# Patient Record
Sex: Female | Born: 1955
Health system: Southern US, Community
[De-identification: ages and names within clinical notes are randomized; demographics above are authoritative.]

## PROBLEM LIST (undated history)

## (undated) DIAGNOSIS — I10 Essential (primary) hypertension: Secondary | ICD-10-CM

## (undated) DIAGNOSIS — M1990 Unspecified osteoarthritis, unspecified site: Secondary | ICD-10-CM

## (undated) DIAGNOSIS — E7800 Pure hypercholesterolemia, unspecified: Secondary | ICD-10-CM

## (undated) DIAGNOSIS — E079 Disorder of thyroid, unspecified: Secondary | ICD-10-CM

## (undated) HISTORY — PX: BILATERAL SALPINGECTOMY: SHX5743

---

## 2020-02-10 ENCOUNTER — Emergency Department (HOSPITAL_COMMUNITY): Payer: HRSA Program

## 2020-02-10 ENCOUNTER — Inpatient Hospital Stay (HOSPITAL_COMMUNITY)
Admission: EM | Admit: 2020-02-10 | Discharge: 2020-02-16 | DRG: 177 | Disposition: A | Discharge status: Home / Self Care | Payer: HRSA Program | Attending: Internal Medicine | Admitting: Internal Medicine

## 2020-02-10 ENCOUNTER — Encounter (HOSPITAL_COMMUNITY): Payer: Self-pay | Admitting: *Deleted

## 2020-02-10 DIAGNOSIS — N179 Acute kidney failure, unspecified: Secondary | ICD-10-CM

## 2020-02-10 DIAGNOSIS — Z79899 Other long term (current) drug therapy: Secondary | ICD-10-CM

## 2020-02-10 DIAGNOSIS — E669 Obesity, unspecified: Secondary | ICD-10-CM | POA: Diagnosis present

## 2020-02-10 DIAGNOSIS — E785 Hyperlipidemia, unspecified: Secondary | ICD-10-CM | POA: Diagnosis present

## 2020-02-10 DIAGNOSIS — R0602 Shortness of breath: Secondary | ICD-10-CM

## 2020-02-10 DIAGNOSIS — J9600 Acute respiratory failure, unspecified whether with hypoxia or hypercapnia: Secondary | ICD-10-CM | POA: Diagnosis present

## 2020-02-10 DIAGNOSIS — R7989 Other specified abnormal findings of blood chemistry: Secondary | ICD-10-CM

## 2020-02-10 DIAGNOSIS — E039 Hypothyroidism, unspecified: Secondary | ICD-10-CM | POA: Diagnosis present

## 2020-02-10 DIAGNOSIS — Z6830 Body mass index (BMI) 30.0-30.9, adult: Secondary | ICD-10-CM

## 2020-02-10 DIAGNOSIS — U071 COVID-19: Secondary | ICD-10-CM | POA: Diagnosis not present

## 2020-02-10 DIAGNOSIS — I10 Essential (primary) hypertension: Secondary | ICD-10-CM | POA: Diagnosis present

## 2020-02-10 DIAGNOSIS — J1282 Pneumonia due to coronavirus disease 2019: Secondary | ICD-10-CM | POA: Diagnosis present

## 2020-02-10 DIAGNOSIS — J9601 Acute respiratory failure with hypoxia: Secondary | ICD-10-CM | POA: Diagnosis present

## 2020-02-10 HISTORY — DX: Disorder of thyroid, unspecified: E07.9

## 2020-02-10 HISTORY — DX: Essential (primary) hypertension: I10

## 2020-02-10 HISTORY — DX: Pure hypercholesterolemia, unspecified: E78.00

## 2020-02-10 LAB — BASIC METABOLIC PANEL
Anion gap: 10 (ref 5–15)
BUN: 38 mg/dL — ABNORMAL HIGH (ref 8–23)
CO2: 21 mmol/L — ABNORMAL LOW (ref 22–32)
Calcium: 9 mg/dL (ref 8.9–10.3)
Chloride: 111 mmol/L (ref 98–111)
Creatinine, Ser: 1.66 mg/dL — ABNORMAL HIGH (ref 0.44–1.00)
GFR, Estimated: 35 mL/min — ABNORMAL LOW (ref >60)
Glucose, Bld: 124 mg/dL — ABNORMAL HIGH (ref 70–99)
Potassium: 4.1 mmol/L (ref 3.5–5.1)
Sodium: 142 mmol/L (ref 135–145)

## 2020-02-10 LAB — SARS CORONAVIRUS 2 (TAT 6-24 HRS): SARS Coronavirus 2: POSITIVE — AB

## 2020-02-10 LAB — CBC
HCT: 42.7 % (ref 36.0–46.0)
Hemoglobin: 13.8 g/dL (ref 12.0–15.0)
MCH: 31.2 pg (ref 26.0–34.0)
MCHC: 32.3 g/dL (ref 30.0–36.0)
MCV: 96.4 fL (ref 80.0–100.0)
Platelets: 226 10*3/uL (ref 150–400)
RBC: 4.43 MIL/uL (ref 3.87–5.11)
RDW: 15.1 % (ref 11.5–15.5)
WBC: 6.3 10*3/uL (ref 4.0–10.5)
nRBC: 0 % (ref 0.0–0.2)

## 2020-02-10 NOTE — ED Triage Notes (Signed)
Per phone interpretor:  Pt is complaining of 2 weeks of back of head pain, nose hurts, cannot breath well, and has a cough.  Denies fever. Pt is alert and in no active distress. Pt sounds congested

## 2020-02-10 NOTE — ED Notes (Signed)
This RN ambulated pt in room O2 sats decrease to 87-88% however while at rest Pt o2 sats increase to 94% on room air notified MD

## 2020-02-11 ENCOUNTER — Encounter (HOSPITAL_COMMUNITY): Payer: Self-pay | Admitting: Internal Medicine

## 2020-02-11 DIAGNOSIS — E785 Hyperlipidemia, unspecified: Secondary | ICD-10-CM | POA: Diagnosis present

## 2020-02-11 DIAGNOSIS — Z79899 Other long term (current) drug therapy: Secondary | ICD-10-CM | POA: Diagnosis not present

## 2020-02-11 DIAGNOSIS — J1282 Pneumonia due to coronavirus disease 2019: Secondary | ICD-10-CM | POA: Diagnosis present

## 2020-02-11 DIAGNOSIS — E039 Hypothyroidism, unspecified: Secondary | ICD-10-CM | POA: Diagnosis present

## 2020-02-11 DIAGNOSIS — N179 Acute kidney failure, unspecified: Secondary | ICD-10-CM | POA: Diagnosis present

## 2020-02-11 DIAGNOSIS — E669 Obesity, unspecified: Secondary | ICD-10-CM | POA: Diagnosis present

## 2020-02-11 DIAGNOSIS — J9600 Acute respiratory failure, unspecified whether with hypoxia or hypercapnia: Secondary | ICD-10-CM | POA: Diagnosis present

## 2020-02-11 DIAGNOSIS — J9601 Acute respiratory failure with hypoxia: Secondary | ICD-10-CM | POA: Diagnosis present

## 2020-02-11 DIAGNOSIS — I10 Essential (primary) hypertension: Secondary | ICD-10-CM

## 2020-02-11 DIAGNOSIS — Z6830 Body mass index (BMI) 30.0-30.9, adult: Secondary | ICD-10-CM | POA: Diagnosis not present

## 2020-02-11 DIAGNOSIS — U071 COVID-19: Principal | ICD-10-CM | POA: Diagnosis present

## 2020-02-11 DIAGNOSIS — R7989 Other specified abnormal findings of blood chemistry: Secondary | ICD-10-CM

## 2020-02-11 LAB — D-DIMER, QUANTITATIVE
D-Dimer, Quant: 1.87 ug/mL-FEU — ABNORMAL HIGH (ref 0.00–0.50)
D-Dimer, Quant: 1.65 ug/mL-FEU — ABNORMAL HIGH (ref 0.00–0.50)

## 2020-02-11 LAB — COMPREHENSIVE METABOLIC PANEL
ALT: 34 U/L (ref 0–44)
AST: 79 U/L — ABNORMAL HIGH (ref 15–41)
Albumin: 2.6 g/dL — ABNORMAL LOW (ref 3.5–5.0)
Alkaline Phosphatase: 126 U/L (ref 38–126)
Anion gap: 12 (ref 5–15)
BUN: 34 mg/dL — ABNORMAL HIGH (ref 8–23)
CO2: 18 mmol/L — ABNORMAL LOW (ref 22–32)
Calcium: 8.1 mg/dL — ABNORMAL LOW (ref 8.9–10.3)
Chloride: 109 mmol/L (ref 98–111)
Creatinine, Ser: 1.58 mg/dL — ABNORMAL HIGH (ref 0.44–1.00)
GFR, Estimated: 37 mL/min — ABNORMAL LOW (ref >60)
Glucose, Bld: 315 mg/dL — ABNORMAL HIGH (ref 70–99)
Potassium: 3.9 mmol/L (ref 3.5–5.1)
Sodium: 139 mmol/L (ref 135–145)
Total Bilirubin: 1 mg/dL (ref 0.3–1.2)
Total Protein: 6.7 g/dL (ref 6.5–8.1)

## 2020-02-11 LAB — CREATININE, SERUM
Creatinine, Ser: 1.62 mg/dL — ABNORMAL HIGH (ref 0.44–1.00)
GFR, Estimated: 36 mL/min — ABNORMAL LOW (ref >60)

## 2020-02-11 LAB — CBC
HCT: 37.8 % (ref 36.0–46.0)
Hemoglobin: 12 g/dL (ref 12.0–15.0)
MCH: 30.8 pg (ref 26.0–34.0)
MCHC: 31.7 g/dL (ref 30.0–36.0)
MCV: 96.9 fL (ref 80.0–100.0)
Platelets: 214 10*3/uL (ref 150–400)
RBC: 3.9 MIL/uL (ref 3.87–5.11)
RDW: 14.9 % (ref 11.5–15.5)
WBC: 5 10*3/uL (ref 4.0–10.5)
nRBC: 0 % (ref 0.0–0.2)

## 2020-02-11 LAB — FIBRINOGEN: Fibrinogen: 641 mg/dL — ABNORMAL HIGH (ref 210–475)

## 2020-02-11 LAB — HIV ANTIBODY (ROUTINE TESTING W REFLEX): HIV Screen 4th Generation wRfx: NONREACTIVE

## 2020-02-11 LAB — TRIGLYCERIDES: Triglycerides: 123 mg/dL (ref <150)

## 2020-02-11 LAB — FERRITIN: Ferritin: 616 ng/mL — ABNORMAL HIGH (ref 11–307)

## 2020-02-11 LAB — C-REACTIVE PROTEIN
CRP: 17.8 mg/dL — ABNORMAL HIGH (ref <1.0)
CRP: 14.6 mg/dL — ABNORMAL HIGH (ref <1.0)

## 2020-02-11 LAB — LACTATE DEHYDROGENASE: LDH: 438 U/L — ABNORMAL HIGH (ref 98–192)

## 2020-02-11 LAB — LACTIC ACID, PLASMA: Lactic Acid, Venous: 1.7 mmol/L (ref 0.5–1.9)

## 2020-02-11 LAB — PROCALCITONIN: Procalcitonin: 0.15 ng/mL

## 2020-02-11 MED ORDER — METHYLPREDNISOLONE SODIUM SUCC 125 MG IJ SOLR
80.0000 mg | Freq: Once | INTRAMUSCULAR | Status: AC
  Administered 2020-02-11: 80 mg via INTRAVENOUS
  Filled 2020-02-11: qty 2

## 2020-02-11 MED ORDER — PREDNISONE 20 MG PO TABS: 50.0000 mg | ORAL_TABLET | Freq: Every day | ORAL | Status: DC

## 2020-02-11 MED ORDER — SODIUM CHLORIDE 0.9 % IV SOLN
200.0000 mg | Freq: Once | INTRAVENOUS | Status: AC
  Administered 2020-02-11: 200 mg via INTRAVENOUS
  Filled 2020-02-11: qty 40

## 2020-02-11 MED ORDER — ENOXAPARIN SODIUM 40 MG/0.4ML ~~LOC~~ SOLN
40.0000 mg | SUBCUTANEOUS | Status: DC
  Administered 2020-02-11 – 2020-02-15 (×5): 40 mg via SUBCUTANEOUS
  Filled 2020-02-11 (×4): qty 0.4

## 2020-02-11 MED ORDER — ASCORBIC ACID 500 MG PO TABS
500.0000 mg | ORAL_TABLET | Freq: Every day | ORAL | Status: DC
  Administered 2020-02-11 – 2020-02-16 (×6): 500 mg via ORAL
  Filled 2020-02-11 (×6): qty 1

## 2020-02-11 MED ORDER — HYDROCOD POLST-CPM POLST ER 10-8 MG/5ML PO SUER: 5.0000 mL | Freq: Two times a day (BID) | ORAL | Status: DC | PRN

## 2020-02-11 MED ORDER — HYDRALAZINE HCL 20 MG/ML IJ SOLN: 10.0000 mg | INTRAMUSCULAR | Status: DC | PRN

## 2020-02-11 MED ORDER — ZINC SULFATE 220 (50 ZN) MG PO CAPS
220.0000 mg | ORAL_CAPSULE | Freq: Every day | ORAL | Status: DC
  Administered 2020-02-11 – 2020-02-16 (×6): 220 mg via ORAL
  Filled 2020-02-11 (×6): qty 1

## 2020-02-11 MED ORDER — METHYLPREDNISOLONE SODIUM SUCC 40 MG IJ SOLR
0.5000 mg/kg | Freq: Two times a day (BID) | INTRAMUSCULAR | Status: DC
  Administered 2020-02-11 – 2020-02-12 (×3): 38.4 mg via INTRAVENOUS
  Filled 2020-02-11 (×2): qty 1

## 2020-02-11 MED ORDER — GUAIFENESIN-DM 100-10 MG/5ML PO SYRP
10.0000 mL | ORAL_SOLUTION | ORAL | Status: DC | PRN
Start: 2020-02-11 — End: 2020-02-16

## 2020-02-11 MED ORDER — ACETAMINOPHEN 325 MG PO TABS: 650.0000 mg | ORAL_TABLET | Freq: Four times a day (QID) | ORAL | Status: DC | PRN

## 2020-02-11 MED ORDER — ALBUTEROL SULFATE HFA 108 (90 BASE) MCG/ACT IN AERS
4.0000 | INHALATION_SPRAY | Freq: Once | RESPIRATORY_TRACT | Status: AC
  Administered 2020-02-11: 4 via RESPIRATORY_TRACT
  Filled 2020-02-11: qty 6.7

## 2020-02-11 MED ORDER — ACETAMINOPHEN 650 MG RE SUPP: 650.0000 mg | Freq: Four times a day (QID) | RECTAL | Status: DC | PRN

## 2020-02-11 MED ORDER — SODIUM CHLORIDE 0.9 % IV SOLN
100.0000 mg | Freq: Every day | INTRAVENOUS | Status: AC
  Administered 2020-02-12 – 2020-02-15 (×4): 100 mg via INTRAVENOUS
  Filled 2020-02-11 (×4): qty 20

## 2020-02-11 MED ORDER — SODIUM CHLORIDE 0.9 % IV BOLUS
500.0000 mL | Freq: Once | INTRAVENOUS | Status: AC
  Administered 2020-02-11: 500 mL via INTRAVENOUS

## 2020-02-11 MED ORDER — ATORVASTATIN CALCIUM 10 MG PO TABS
20.0000 mg | ORAL_TABLET | Freq: Every day | ORAL | Status: DC
  Administered 2020-02-11 – 2020-02-16 (×6): 20 mg via ORAL
  Filled 2020-02-11 (×6): qty 2

## 2020-02-11 NOTE — H&P (Signed)
History and Physical    Amanda Blanchard DTO:[id number] DOB: 1955/01/23 DOA: 02/10/2020  PCP: Patient, No Pcp Per  Patient coming from: Home.  Chief Complaint: Shortness of breath.  Spanish interpreter used.  HPI: Amanda Blanchard is a 65 y.o. female with history of hypertension and hyperlipidemia presents to the ER because of worsening shortness of breath over the last 2 weeks.  Patient has been a nonproductive cough with some pleuritic chest pain.  Has not taken any Covid vaccination.  ED Course: The ER patient was hypoxic requiring 2 L oxygen when was appearing tachypneic chest x-ray showing infiltrates consistent with pneumonia and Covid test was positive.  CRP was 17.8 D-dimer was 1.87.  Patient admitted for further management of acute respiratory failure with hypoxia secondary to Covid infection.  Review of Systems: As per HPI, rest all negative.   Past Medical History:  Diagnosis Date  . Hypercholesteremia   . Hypertension   . Thyroid disease     History reviewed. No pertinent surgical history.   reports that she has never smoked. She has never used smokeless tobacco. She reports previous alcohol use. She reports previous drug use.  Not on File  Family History  Problem Relation Age of Onset  . Diabetes Mellitus II Neg Hx     Prior to Admission medications   Medication Sig Start Date End Date Taking? Authorizing Provider  atorvastatin (LIPITOR) 20 MG tablet Take 20 mg by mouth daily. 01/26/20   [provider]  diclofenac (VOLTAREN) 75 MG EC tablet Take 75 mg by mouth 2 (two) times daily as needed. 01/26/20   [provider]  fluconazole (DIFLUCAN) 150 MG tablet Take 150 mg by mouth once a week. 01/26/20   [provider]  lisinopril-hydrochlorothiazide (ZESTORETIC) 10-12.5 MG tablet Take 1 tablet by mouth daily. 01/26/20   [provider]  sulfamethoxazole-trimethoprim (BACTRIM DS) 800-160 MG tablet Take 1 tablet by mouth 2  (two) times daily. 01/26/20   [provider]    Physical Exam: Constitutional: Moderately built and nourished. Vitals:   02/10/20 2200 02/11/20 0000 02/11/20 0100 02/11/20 0145  BP: 121/70  (!) 102/51 (!) 110/57  Pulse: 86  84 70  Resp: (!) 27  18 20   Temp:      TempSrc:      SpO2: 92%  91% 100%  Weight:  77.1 kg    Height:  5\' 3"  (1.6 m)     Eyes: Anicteric no pallor. ENMT: No discharge from the ears eyes nose or mouth. Neck: No mass felt.  No neck rigidity. Respiratory: No rhonchi or crepitations. Cardiovascular: S1-S2 heard. Abdomen: Soft nontender bowel sounds present. Musculoskeletal: No edema. Skin: No rash. Neurologic: Alert awake oriented to time place and person.  Moves all extremities. Psychiatric: Appears normal.  Normal affect.   Labs on Admission: I have personally reviewed following labs and imaging studies  CBC: Recent Labs  Lab 02/10/20 1225  WBC 6.3  HGB 13.8  HCT 42.7  MCV 96.4  PLT 226   Basic Metabolic Panel: Recent Labs  Lab 02/10/20 1225  NA 142  K 4.1  CL 111  CO2 21*  GLUCOSE 124*  BUN 38*  CREATININE 1.66*  CALCIUM 9.0   GFR: Estimated Creatinine Clearance: 34.6 mL/min (A) (by C-G formula based on SCr of 1.66 mg/dL (H)). Liver Function Tests: No results for input(s): AST, ALT, ALKPHOS, BILITOT, PROT, ALBUMIN in the last 168 hours. No results for input(s): LIPASE, AMYLASE in the last  168 hours. No results for input(s): AMMONIA in the last 168 hours. Coagulation Profile: No results for input(s): INR, PROTIME in the last 168 hours. Cardiac Enzymes: No results for input(s): CKTOTAL, CKMB, CKMBINDEX, TROPONINI in the last 168 hours. BNP (last 3 results) No results for input(s): PROBNP in the last 8760 hours. HbA1C: No results for input(s): HGBA1C in the last 72 hours. CBG: No results for input(s): GLUCAP in the last 168 hours. Lipid Profile: Recent Labs    02/11/20 0105  TRIG 123   Thyroid Function Tests: No  results for input(s): TSH, T4TOTAL, FREET4, T3FREE, THYROIDAB in the last 72 hours. Anemia Panel: Recent Labs    02/11/20 0105  FERRITIN 616*   Urine analysis: No results found for: COLORURINE, APPEARANCEUR, LABSPEC, PHURINE, GLUCOSEU, HGBUR, BILIRUBINUR, KETONESUR, PROTEINUR, UROBILINOGEN, NITRITE, LEUKOCYTESUR Sepsis Labs: @LABRCNTIP (procalcitonin:4,lacticidven:4) ) Recent Results (from the past 240 hour(s))  SARS CORONAVIRUS 2 (TAT 6-24 HRS) Nasopharyngeal Nasopharyngeal Swab     Status: Abnormal   Collection Time: 02/10/20 12:24 PM   Specimen: Nasopharyngeal Swab  Result Value Ref Range Status   SARS Coronavirus 2 POSITIVE (A) NEGATIVE Final    Comment: (NOTE) SARS-CoV-2 target nucleic acids are DETECTED.  The SARS-CoV-2 RNA is generally detectable in upper and lower respiratory specimens during the acute phase of infection. Positive results are indicative of the presence of SARS-CoV-2 RNA. Clinical correlation with patient history and other diagnostic information is  necessary to determine patient infection status. Positive results do not rule out bacterial infection or co-infection with other viruses.  The expected result is Negative.  Fact Sheet for Patients: 02/12/20  Fact Sheet for Healthcare Providers: HairSlick.no  This test is not yet approved or cleared by the quierodirigir.com FDA and  has been authorized for detection and/or diagnosis of SARS-CoV-2 by FDA under an Emergency Use Authorization (EUA). This EUA will remain  in effect (meaning this test can be used) for the duration of the COVID-19 declaration under Section 564(b)(1) of the Act, 21 U. S.C. section 360bbb-3(b)(1), unless the authorization is terminated or revoked sooner.   Performed at [facility], [address], [place], [place]      Radiological Exams on Admission: DG Chest Portable 1 View  Result Date:  02/10/2020 CLINICAL DATA:  Cough.  Shortness of breath. EXAM: PORTABLE CHEST 1 VIEW COMPARISON:  No prior. FINDINGS: Heart size normal. Diffuse bilateral interstitial infiltrates most consistent with pneumonitis. No pleural effusion or pneumothorax. Degenerative change thoracic spine. IMPRESSION: Diffuse bilateral interstitial infiltrates most consistent with pneumonitis. Question patient's COVID status. Electronically Signed   By: 02/12/2020  Register   On: 02/10/2020 12:52    EKG: Independently reviewed.  Normal sinus rhythm.  Assessment/Plan Principal Problem:   Acute respiratory failure due to COVID-19 [facility]) Active Problems:   Essential hypertension   Hypothyroidism   HLD (hyperlipidemia)    1. Acute respiratory failure with hypoxia presently on 2 L oxygen to maintain sats more than 90% likely from Covid infection for which patient is on IV Solu-Medrol and remdesivir follow including markers respiratory status. 2. Acute renal failure no old labs to compare could be from dehydration holding lisinopril hydrochlorothiazide for now.  Patient received 1 L fluid bolus in the ER.  Follow metabolic panel. 3. History hypertension holding lisinopril hydrochlorothiazide due to acute renal failure we will keep patient on as needed IV hydralazine. 4. History of hyperlipidemia on statins.  Since patient has acute respiratory failure with hypoxia with Covid infection will need inpatient  status.   DVT prophylaxis: Lovenox. Code Status: Full code. Family Communication: Discussed with patient. Disposition Plan: Home. Consults called: None. Admission status: Inpatient.   Eduard Clos MD [facility] Pager [phone].  If 7PM-7AM, please contact night-coverage [email or web address] Password [email or web address]  02/11/2020, 3:02 AM

## 2020-02-11 NOTE — ED Provider Notes (Signed)
[facility] EMERGENCY DEPARTMENT Provider Note   CSN: [id number] Arrival date & time: 02/10/20  1023     History Chief Complaint  Patient presents with  . Cough  . Headache  . Chest Pain    Amanda Blanchard is a 65 y.o. female with a h/o of HTN, hypercholesteremia, and thyroid disease who presents the emergency department with a chief complaint of shortness of breath.  The patient reports a 2-week history of progressively worsening shortness of breath, cough, fatigue, myalgias, chills, nasal congestion, and headaches.  No known aggravating or alleviating factors.  No vomiting, diarrhea, abdominal pain, dysuria, hematuria, decreased urinary output, dizziness, lightheadedness, syncope, neck pain or stiffness, chest pain, leg swelling.  She is unvaccinated against COVID-19.  No history of problems with her kidneys.  She does report that she has a thyroid problem, but is unsure of the the diagnosis.  The history is provided by the patient and medical records. A language interpreter was used ([place]).       Past Medical History:  Diagnosis Date  . Hypercholesteremia   . Hypertension   . Thyroid disease     Patient Active Problem List   Diagnosis Date Noted  . Acute respiratory failure due to COVID-19 (HCC) 02/11/2020  . Essential hypertension 02/11/2020  . Hypothyroidism 02/11/2020  . HLD (hyperlipidemia) 02/11/2020    History reviewed. No pertinent surgical history.   OB History   No obstetric history on file.     Family History  Problem Relation Age of Onset  . Diabetes Mellitus II Neg Hx     Social History   Tobacco Use  . Smoking status: Never Smoker  . Smokeless tobacco: Never Used  Substance Use Topics  . Alcohol use: Not Currently  . Drug use: Not Currently    Home Medications Prior to Admission medications   Medication Sig Start Date End Date Taking? Authorizing Provider  atorvastatin (LIPITOR) 20 MG tablet Take 20 mg by  mouth daily. 01/26/20  Yes [provider]  diclofenac (VOLTAREN) 75 MG EC tablet Take 75 mg by mouth 2 (two) times daily as needed for mild pain. 01/26/20  Yes [provider]  lisinopril-hydrochlorothiazide (ZESTORETIC) 10-12.5 MG tablet Take 1 tablet by mouth daily. 01/26/20  Yes [provider]  fluconazole (DIFLUCAN) 150 MG tablet Take 150 mg by mouth once a week. Patient not taking: Reported on 02/11/2020 01/26/20   [provider]  sulfamethoxazole-trimethoprim (BACTRIM DS) 800-160 MG tablet Take 1 tablet by mouth 2 (two) times daily. Patient not taking: Reported on 02/11/2020 01/26/20   [provider]    Allergies    Patient has no allergy information on record.  Review of Systems   Review of Systems  Constitutional: Positive for chills and fatigue. Negative for fever.  HENT: Positive for congestion. Negative for ear discharge, ear pain, sinus pressure, sinus pain and sore throat.   Eyes: Negative for visual disturbance.  Respiratory: Positive for cough and shortness of breath.   Cardiovascular: Negative for palpitations.  Gastrointestinal: Negative for abdominal pain, constipation, diarrhea, nausea and vomiting.  Genitourinary: Negative for difficulty urinating, dysuria, urgency and vaginal pain.  Musculoskeletal: Positive for myalgias. Negative for arthralgias, neck pain and neck stiffness.  Skin: Negative for wound.  Allergic/Immunologic: Negative for immunocompromised state.  Neurological: Positive for headaches. Negative for seizures, weakness and numbness.    Physical Exam Updated Vital Signs BP (!) 110/57   Pulse 70   Temp 98.2 F (36.8 C) (Oral)  Resp 20   Ht 5\' 3"  (1.6 m)   Wt 77.1 kg   SpO2 100%   BMI 30.11 kg/m   Physical Exam Vitals and nursing note reviewed.  Constitutional:      General: She is not in acute distress.    Appearance: She is not ill-appearing or toxic-appearing.  HENT:     Head: Normocephalic.      Nose: Congestion present. No rhinorrhea.     Mouth/Throat:     Mouth: Mucous membranes are moist.  Eyes:     Conjunctiva/sclera: Conjunctivae normal.  Cardiovascular:     Rate and Rhythm: Normal rate and regular rhythm.     Heart sounds: No murmur heard. No friction rub. No gallop.   Pulmonary:     Effort: No respiratory distress.     Breath sounds: No stridor. No wheezing, rhonchi or rales.     Comments: Able to speak in complete, fluent sentences at rest, but patient becomes markedly tachypneic with any exertion, including leaning forward on exam.  Lungs are clear to auscultation bilaterally. Abdominal:     General: There is no distension.     Palpations: Abdomen is soft. There is no mass.     Tenderness: There is no abdominal tenderness. There is no right CVA tenderness, left CVA tenderness, guarding or rebound.     Hernia: No hernia is present.  Musculoskeletal:        General: No tenderness.     Cervical back: Neck supple.     Right lower leg: No edema.     Left lower leg: No edema.  Skin:    General: Skin is warm.     Capillary Refill: Capillary refill takes less than 2 seconds.     Coloration: Skin is not jaundiced.     Findings: No rash.  Neurological:     Mental Status: She is alert.  Psychiatric:        Behavior: Behavior normal.     ED Results / Procedures / Treatments   Labs (all labs ordered are listed, but only abnormal results are displayed) Labs Reviewed  SARS CORONAVIRUS 2 (TAT 6-24 HRS) - Abnormal; Notable for the following components:      Result Value   SARS Coronavirus 2 POSITIVE (*)    All other components within normal limits  BASIC METABOLIC PANEL - Abnormal; Notable for the following components:   CO2 21 (*)    Glucose, Bld 124 (*)    BUN 38 (*)    Creatinine, Ser 1.66 (*)    GFR, Estimated 35 (*)    All other components within normal limits  D-DIMER, QUANTITATIVE (NOT AT [facility]) - Abnormal; Notable for the following components:   D-Dimer,  Quant 1.87 (*)    All other components within normal limits  LACTATE DEHYDROGENASE - Abnormal; Notable for the following components:   LDH 438 (*)    All other components within normal limits  FERRITIN - Abnormal; Notable for the following components:   Ferritin 616 (*)    All other components within normal limits  FIBRINOGEN - Abnormal; Notable for the following components:   Fibrinogen 641 (*)    All other components within normal limits  C-REACTIVE PROTEIN - Abnormal; Notable for the following components:   CRP 17.8 (*)    All other components within normal limits  CREATININE, SERUM - Abnormal; Notable for the following components:   Creatinine, Ser 1.62 (*)    GFR, Estimated 36 (*)    All  other components within normal limits  CULTURE, BLOOD (ROUTINE X 2)  CULTURE, BLOOD (ROUTINE X 2)  CBC  LACTIC ACID, PLASMA  PROCALCITONIN  TRIGLYCERIDES  CBC  HIV ANTIBODY (ROUTINE TESTING W REFLEX)    EKG None  Radiology DG Chest Portable 1 View  Result Date: 02/10/2020 CLINICAL DATA:  Cough.  Shortness of breath. EXAM: PORTABLE CHEST 1 VIEW COMPARISON:  No prior. FINDINGS: Heart size normal. Diffuse bilateral interstitial infiltrates most consistent with pneumonitis. No pleural effusion or pneumothorax. Degenerative change thoracic spine. IMPRESSION: Diffuse bilateral interstitial infiltrates most consistent with pneumonitis. Question patient's COVID status. Electronically Signed   By: Maisie Fus  Register   On: 02/10/2020 12:52    Procedures Procedures   Medications Ordered in ED Medications  atorvastatin (LIPITOR) tablet 20 mg (has no administration in time range)  enoxaparin (LOVENOX) injection 40 mg (has no administration in time range)  acetaminophen (TYLENOL) tablet 650 mg (has no administration in time range)    Or  acetaminophen (TYLENOL) suppository 650 mg (has no administration in time range)  remdesivir 200 mg in sodium chloride 0.9% 250 mL IVPB (has no administration in  time range)    Followed by  remdesivir 100 mg in sodium chloride 0.9 % 100 mL IVPB (has no administration in time range)  methylPREDNISolone sodium succinate (SOLU-MEDROL) 40 mg/mL injection 38.4 mg (38.4 mg Intravenous Given 02/11/20 0340)    Followed by  predniSONE (DELTASONE) tablet 50 mg (has no administration in time range)  guaiFENesin-dextromethorphan (ROBITUSSIN DM) 100-10 MG/5ML syrup 10 mL (has no administration in time range)  chlorpheniramine-HYDROcodone (TUSSIONEX) 10-8 MG/5ML suspension 5 mL (has no administration in time range)  ascorbic acid (VITAMIN C) tablet 500 mg (has no administration in time range)  zinc sulfate capsule 220 mg (has no administration in time range)  hydrALAZINE (APRESOLINE) injection 10 mg (has no administration in time range)  sodium chloride 0.9 % bolus 500 mL (0 mLs Intravenous Stopped 02/11/20 0357)  albuterol (VENTOLIN HFA) 108 (90 Base) MCG/ACT inhaler 4 puff (4 puffs Inhalation Given 02/11/20 0137)  methylPREDNISolone sodium succinate (SOLU-MEDROL) 125 mg/2 mL injection 80 mg (80 mg Intravenous Given 02/11/20 0232)    ED Course  I have reviewed the triage vital signs and the nursing notes.  Pertinent labs & imaging results that were available during my care of the patient were reviewed by me and considered in my medical decision making (see chart for details).  Clinical Course as of 02/11/20 0420  Wed Feb 11, 2020  0038 Patient with oxygen saturation of 90% at rest.  With positional changes, patient becomes increasingly tachypneic with respirations at approximately 35 a minute. [MM]    Clinical Course User Index [MM] Adasha Boehme, Coral Else, PA-C   MDM Rules/Calculators/A&P                          65 year old female with a h/o of HTN, hypercholesteremia, and thyroid disease who presents the emergency department with 2 weeks of URI symptoms.  Shortness of breath has been worsening.  Afebrile.  Normotensive.  No tachycardia.  Oxygen saturation has been  in the low 90s at rest, but on my evaluation is 90%.  On exam, patient becomes very tachypneic with minimal movement, including even leaning forward for exam with respiration rate increasing to 35.  The patient was ambulated in the emergency department and had oxygen saturation of 87 to 87%.  Labs and imaging have been reviewed and independently interpreted by me.  COVID-19 test is positive.  She also has a creatinine of 1.66.  Upon further investigation, no history of kidney disease.  I am suspicious of acute AKI.  Will order IV fluid bolus, Solu-Medrol, and albuterol.  Patient is not a candidate for remdesivir since she has been having symptoms for 14 days.  The patient will require admission for acute respiratory failure secondary to COVID-19.  Consult to the hospitalist team and Dr. Toniann Fail will accept the patient for admission. The patient appears reasonably stabilized for admission considering the current resources, flow, and capabilities available in the ED at this time, and I doubt any other [facility] requiring further screening and/or treatment in the ED prior to admission.   Final Clinical Impression(s) / ED Diagnoses Final diagnoses:  Acute respiratory failure with hypoxia (HCC)  Elevated serum creatinine    Rx / DC Orders ED Discharge Orders    None       Barkley Boards, PA-C 02/11/20 0420    Geoffery Lyons, MD 02/11/20 [phone]

## 2020-02-11 NOTE — ED Notes (Signed)
Pt ambulatory to RR, no assistance needed from staff; gait steady & even

## 2020-02-11 NOTE — ED Notes (Signed)
Lunch Tray Ordered @ 1032. 

## 2020-02-11 NOTE — Progress Notes (Signed)
PROGRESS NOTE                                                                                                                                                                                                             Amanda Blanchard Demographics:    Amanda Blanchard, is a 65 y.o. female, DOB - 14-Jun-1955, ALP:[id number]  Outpatient Primary MD for the Amanda Blanchard is Amanda Blanchard, No Pcp Per   Admit date - 02/10/2020   LOS - 0  Chief Complaint  Amanda Blanchard presents with  . Cough  . Headache  . Chest Pain       Brief Narrative: Amanda Blanchard is a 65 y.o. female with PMHx of HTN, HLD-presented to the hospital with worsening shortness of breath/cough-ongoing for 2 weeks but worsening over the past few days-found to have acute hypoxic respiratory failure due to COVID-19 pneumonia.  COVID-19 vaccinated status: Unvaccinated  Significant Events: 1/25>> Admit to [facility] for hypoxia due to COVID-19 pneumonia  Significant studies: 1/25>>Chest x-ray:Diffuse bilateral infiltrates  COVID-19 medications: Steroids: 1/25>> Remdesivir: 1/25>>  Antibiotics: None  Microbiology data: 1/26 >>blood culture: Pending  Procedures: None  Consults: None  DVT prophylaxis: enoxaparin (LOVENOX) injection 40 mg Start: 02/11/20 0600    Subjective:    [name] today feels better-some cough on minimal oxygen on room air this morning.   Assessment  & Plan :   Acute Hypoxic Resp Failure due to Covid 19 Viral pneumonia: Has mild disease but significantly elevated CRP-continue steroids and Remdesivir.  If in the unlikely event that hypoxemia to worsen-she will be dosed with Actemra.  Continue at attempts to titrate off oxygen.  Fever: afebrile O2 requirements:  SpO2: 96 % O2 Flow Rate (L/min): 1.5 L/min   COVID-19 Labs: Recent Labs    02/11/20 0105  DDIMER 1.87*  FERRITIN 616*  LDH 438*  CRP 17.8*    No results found for:  BNP  Recent Labs  Lab 02/11/20 0105  PROCALCITON 0.15    Lab Results  Component Value Date   SARSCOV2NAA POSITIVE (A) 02/10/2020     Prone/Incentive Spirometry: encouraged  incentive spirometry use 3-4/hour.  AKI: Mild-likely hemodynamically mediated-in the setting of COVID-19 infection, lisinopril/HCTZ use-supportive care for now-repeat electrolytes tomorrow.  Avoid nephrotoxic agents.  HTN: BP stable without the use of antihypertensives-follow and resume when able.  HLD: Continue statin  Hypothyroidism: Continue levothyroxine  Obesity: Estimated body mass index is 30.11 kg/m as calculated from the following:   Height as of this encounter: 5\' 3"  (1.6 m).   Weight as of this encounter: 77.1 kg.   ABG: No results found for: PHART, PCO2ART, PO2ART, HCO3, TCO2, ACIDBASEDEF, O2SAT  Vent Settings: N/A   Condition - Stable  Family Communication  : Amanda Blanchard alert/awake-able to update family herself.  She is aware of potential plans for discharge in the next 1-2 days if clinical improvement continues.  Code Status :  Full Code  Diet :  Diet Order            Diet Heart Room service appropriate? Yes; Fluid consistency: Thin  Diet effective now                  Disposition Plan  :   Status is: Inpatient  Remains inpatient appropriate because:Inpatient level of care appropriate due to severity of illness   Dispo: The Amanda Blanchard is from: Home              Anticipated d/c is to: Home              Anticipated d/c date is: 2 days              Amanda Blanchard currently is not medically stable to d/c.   Difficult to place Amanda Blanchard No    Barriers to discharge: Hypoxia requiring O2 supplementation/complete 5 days of IV Remdesivir  Antimicorbials  :    Anti-infectives (From admission, onward)   Start     Dose/Rate Route Frequency Ordered Stop   02/12/20 1000  remdesivir 100 mg in sodium chloride 0.9 % 100 mL IVPB       "Followed by" Linked Group Details   100 mg 200 mL/hr over  30 Minutes Intravenous Daily 02/11/20 0301 02/16/20 0959   02/11/20 0300  remdesivir 200 mg in sodium chloride 0.9% 250 mL IVPB       "Followed by" Linked Group Details   200 mg 580 mL/hr over 30 Minutes Intravenous Once 02/11/20 0301 02/11/20 02/13/20      Inpatient Medications  Scheduled Meds: . vitamin C  500 mg Oral Daily  . atorvastatin  20 mg Oral Daily  . enoxaparin (LOVENOX) injection  40 mg Subcutaneous Q24H  . methylPREDNISolone (SOLU-MEDROL) injection  0.5 mg/kg Intravenous Q12H   Followed by  . [START ON 02/14/2020] predniSONE  50 mg Oral Daily  . zinc sulfate  220 mg Oral Daily   Continuous Infusions: . [START ON 02/12/2020] remdesivir 100 mg in NS 100 mL     PRN Meds:.acetaminophen **OR** acetaminophen, chlorpheniramine-HYDROcodone, guaiFENesin-dextromethorphan, hydrALAZINE   Time Spent in minutes  25  See all Orders from today for further details   02/14/2020 M.D on 02/11/2020 at 9:39 AM  To page go to [email or web address] - use universal password  [facility] -  Office  [phone]    Objective:   Vitals:   02/11/20 0645 02/11/20 0700 02/11/20 0800 02/11/20 0845  BP:   115/63 (!) 112/59  Pulse: (!) 58 (!) 59 75 74  Resp: 19 20 20 17   Temp:      TempSrc:      SpO2: 97% 99% 93% 96%  Weight:      Height:        Wt Readings from Last 3 Encounters:  02/11/20 77.1 kg     Intake/Output Summary (Last 24 hours) at 02/11/2020 02/13/20 Last data filed at 02/11/2020  0357 Gross per 24 hour  Intake 500 ml  Output --  Net 500 ml     Physical Exam Gen Exam:Alert awake-not in any distress HEENT:atraumatic, normocephalic Chest: B/L clear to auscultation anteriorly CVS:S1S2 regular Abdomen:soft non tender, non distended Extremities:no edema Neurology: Non focal Skin: no rash   Data Review:    CBC Recent Labs  Lab 02/10/20 1225 02/11/20 0318  WBC 6.3 5.0  HGB 13.8 12.0  HCT 42.7 37.8  PLT 226 214  MCV 96.4 96.9  MCH 31.2 30.8  MCHC 32.3  31.7  RDW 15.1 14.9    Chemistries  Recent Labs  Lab 02/10/20 1225 02/11/20 0318  NA 142  --   K 4.1  --   CL 111  --   CO2 21*  --   GLUCOSE 124*  --   BUN 38*  --   CREATININE 1.66* 1.62*  CALCIUM 9.0  --    ------------------------------------------------------------------------------------------------------------------ Recent Labs    02/11/20 0105  TRIG 123    No results found for: HGBA1C ------------------------------------------------------------------------------------------------------------------ No results for input(s): TSH, T4TOTAL, T3FREE, THYROIDAB in the last 72 hours.  Invalid input(s): FREET3 ------------------------------------------------------------------------------------------------------------------ Recent Labs    02/11/20 0105  FERRITIN 616*    Coagulation profile No results for input(s): INR, PROTIME in the last 168 hours.  Recent Labs    02/11/20 0105  DDIMER 1.87*    Cardiac Enzymes No results for input(s): CKMB, TROPONINI, MYOGLOBIN in the last 168 hours.  Invalid input(s): CK ------------------------------------------------------------------------------------------------------------------ No results found for: BNP  Micro Results Recent Results (from the past 240 hour(s))  SARS CORONAVIRUS 2 (TAT 6-24 HRS) Nasopharyngeal Nasopharyngeal Swab     Status: Abnormal   Collection Time: 02/10/20 12:24 PM   Specimen: Nasopharyngeal Swab  Result Value Ref Range Status   SARS Coronavirus 2 POSITIVE (A) NEGATIVE Final    Comment: (NOTE) SARS-CoV-2 target nucleic acids are DETECTED.  The SARS-CoV-2 RNA is generally detectable in upper and lower respiratory specimens during the acute phase of infection. Positive results are indicative of the presence of SARS-CoV-2 RNA. Clinical correlation with Amanda Blanchard history and other diagnostic information is  necessary to determine Amanda Blanchard infection status. Positive results do not rule out  bacterial infection or co-infection with other viruses.  The expected result is Negative.  Fact Sheet for Patients: HairSlick.no  Fact Sheet for Healthcare Providers: quierodirigir.com  This test is not yet approved or cleared by the [place] FDA and  has been authorized for detection and/or diagnosis of SARS-CoV-2 by FDA under an Emergency Use Authorization (EUA). This EUA will remain  in effect (meaning this test can be used) for the duration of the COVID-19 declaration under Section 564(b)(1) of the Act, 21 U. S.C. section 360bbb-3(b)(1), unless the authorization is terminated or revoked sooner.   Performed at [facility], [address] [place], [place] [address]     Radiology Reports DG Chest Portable 1 View  Result Date: 02/10/2020 CLINICAL DATA:  Cough.  Shortness of breath. EXAM: PORTABLE CHEST 1 VIEW COMPARISON:  No prior. FINDINGS: Heart size normal. Diffuse bilateral interstitial infiltrates most consistent with pneumonitis. No pleural effusion or pneumothorax. Degenerative change thoracic spine. IMPRESSION: Diffuse bilateral interstitial infiltrates most consistent with pneumonitis. Question Amanda Blanchard's COVID status. Electronically Signed   By: Maisie Fus  Register   On: 02/10/2020 12:52

## 2020-02-12 ENCOUNTER — Other Ambulatory Visit: Payer: Self-pay

## 2020-02-12 LAB — COMPREHENSIVE METABOLIC PANEL
ALT: 31 U/L (ref 0–44)
AST: 62 U/L — ABNORMAL HIGH (ref 15–41)
Albumin: 2.4 g/dL — ABNORMAL LOW (ref 3.5–5.0)
Alkaline Phosphatase: 107 U/L (ref 38–126)
Anion gap: 9 (ref 5–15)
BUN: 41 mg/dL — ABNORMAL HIGH (ref 8–23)
CO2: 19 mmol/L — ABNORMAL LOW (ref 22–32)
Calcium: 8.7 mg/dL — ABNORMAL LOW (ref 8.9–10.3)
Chloride: 111 mmol/L (ref 98–111)
Creatinine, Ser: 1.57 mg/dL — ABNORMAL HIGH (ref 0.44–1.00)
GFR, Estimated: 37 mL/min — ABNORMAL LOW (ref >60)
Glucose, Bld: 155 mg/dL — ABNORMAL HIGH (ref 70–99)
Potassium: 4.3 mmol/L (ref 3.5–5.1)
Sodium: 139 mmol/L (ref 135–145)
Total Bilirubin: 1.4 mg/dL — ABNORMAL HIGH (ref 0.3–1.2)
Total Protein: 6.2 g/dL — ABNORMAL LOW (ref 6.5–8.1)

## 2020-02-12 LAB — CBC WITH DIFFERENTIAL/PLATELET
Abs Immature Granulocytes: 0.05 10*3/uL (ref 0.00–0.07)
Basophils Absolute: 0 10*3/uL (ref 0.0–0.1)
Basophils Relative: 0 %
Eosinophils Absolute: 0 10*3/uL (ref 0.0–0.5)
Eosinophils Relative: 0 %
HCT: 34.4 % — ABNORMAL LOW (ref 36.0–46.0)
Hemoglobin: 11.7 g/dL — ABNORMAL LOW (ref 12.0–15.0)
Immature Granulocytes: 1 %
Lymphocytes Relative: 13 %
Lymphs Abs: 1 10*3/uL (ref 0.7–4.0)
MCH: 32.4 pg (ref 26.0–34.0)
MCHC: 34 g/dL (ref 30.0–36.0)
MCV: 95.3 fL (ref 80.0–100.0)
Monocytes Absolute: 0.4 10*3/uL (ref 0.1–1.0)
Monocytes Relative: 5 %
Neutro Abs: 6.6 10*3/uL (ref 1.7–7.7)
Neutrophils Relative %: 81 %
Platelets: 245 10*3/uL (ref 150–400)
RBC: 3.61 MIL/uL — ABNORMAL LOW (ref 3.87–5.11)
RDW: 14.6 % (ref 11.5–15.5)
WBC: 8.1 10*3/uL (ref 4.0–10.5)
nRBC: 0 % (ref 0.0–0.2)

## 2020-02-12 LAB — C-REACTIVE PROTEIN: CRP: 11 mg/dL — ABNORMAL HIGH (ref <1.0)

## 2020-02-12 LAB — D-DIMER, QUANTITATIVE: D-Dimer, Quant: 1.87 ug/mL-FEU — ABNORMAL HIGH (ref 0.00–0.50)

## 2020-02-12 MED ORDER — METHYLPREDNISOLONE SODIUM SUCC 125 MG IJ SOLR
60.0000 mg | Freq: Two times a day (BID) | INTRAMUSCULAR | Status: DC
  Administered 2020-02-12 – 2020-02-14 (×5): 60 mg via INTRAVENOUS
  Filled 2020-02-12 (×5): qty 2

## 2020-02-12 MED ORDER — BENZONATATE 100 MG PO CAPS
200.0000 mg | ORAL_CAPSULE | Freq: Three times a day (TID) | ORAL | Status: DC
  Administered 2020-02-12 – 2020-02-16 (×13): 200 mg via ORAL
  Filled 2020-02-12 (×13): qty 2

## 2020-02-12 MED ORDER — ALBUTEROL SULFATE HFA 108 (90 BASE) MCG/ACT IN AERS
2.0000 | INHALATION_SPRAY | Freq: Four times a day (QID) | RESPIRATORY_TRACT | Status: DC
  Administered 2020-02-12 – 2020-02-14 (×12): 2 via RESPIRATORY_TRACT
  Filled 2020-02-12: qty 6.7

## 2020-02-12 NOTE — Progress Notes (Signed)
The patient was transferred from [facility] to [phone]. She is on 3L O2 [place] and is not complaining of any pain. Report has been called to telemetry. Vital signs are stable. Instructed patient on using the call bell. Skin intact.   Spanish interpreter will be used to discuss plan of care with patient and current medications.  Patient belongings include cell phone, charger, shoes, purse, 2 jackets.

## 2020-02-12 NOTE — Plan of Care (Signed)
°  Problem: Education: Goal: Knowledge of risk factors and measures for prevention of condition will improve Outcome: Progressing   Problem: Coping: Goal: Psychosocial and spiritual needs will be supported Outcome: Progressing   Problem: Respiratory: Goal: Will maintain a patent airway Outcome: Progressing Goal: Complications related to the disease process, condition or treatment will be avoided or minimized Outcome: Progressing   Problem: Clinical Measurements: Goal: Will remain free from infection Outcome: Progressing Goal: Respiratory complications will improve Outcome: Progressing Goal: Cardiovascular complication will be avoided Outcome: Progressing   Problem: Activity: Goal: Risk for activity intolerance will decrease Outcome: Progressing   Problem: Nutrition: Goal: Adequate nutrition will be maintained Outcome: Progressing   Problem: Safety: Goal: Ability to remain free from injury will improve Outcome: Progressing   Problem: Skin Integrity: Goal: Risk for impaired skin integrity will decrease Outcome: Progressing

## 2020-02-12 NOTE — Progress Notes (Addendum)
PROGRESS NOTE                                                                                                                                                                                                             Patient Demographics:    Amanda Blanchard, is a 65 y.o. female, DOB - 03/18/1955, TML:[id number]  Outpatient Primary MD for the patient is Patient, No Pcp Per   Admit date - 02/10/2020   LOS - 1  Chief Complaint  Patient presents with  . Cough  . Headache  . Chest Pain       Brief Narrative: Patient is a 65 y.o. female with PMHx of HTN, HLD-presented to the hospital with worsening shortness of breath/cough-ongoing for 2 weeks but worsening over the past few days-found to have acute hypoxic respiratory failure due to COVID-19 pneumonia.  COVID-19 vaccinated status: Unvaccinated  Significant Events: 1/25>> Admit to [facility] for hypoxia due to COVID-19 pneumonia  Significant studies: 1/25>>Chest x-ray:Diffuse bilateral infiltrates  COVID-19 medications: Steroids: 1/25>> Remdesivir: 1/25>>  Antibiotics: None  Microbiology data: 1/26 >>blood culture:no growth  Procedures: None  Consults: None  DVT prophylaxis: enoxaparin (LOVENOX) injection 40 mg Start: 02/11/20 0600    Subjective:   On 4 L of oxygen overnight-decrease to 2 L this morning.  Still coughing-does acknowledge exertional dyspnea.   Assessment  & Plan :   Acute Hypoxic Resp Failure due to Covid 19 Viral pneumonia: Continues to have cough-and exertional dyspnea-CRP downtrending-required around 4 L of oxygen last night-titrated back down to 2 L today.  Continue steroids/Remdesivir-if hypoxemia would worsen and oxygen requirement starts to increase beyond 5 L-we will dose with Actemra.    Note-rationale/risk/benefits of Actemra discussed with patient-she has no history of TB, diverticulitis, hep B-understands the risk of  opportunistic/bacterial infections-and consents to the use of Actemra.  Fever: afebrile O2 requirements:  SpO2: 94 % O2 Flow Rate (L/min): 3 L/min   COVID-19 Labs: Recent Labs    02/11/20 0105 02/11/20 0800 02/12/20 0453  DDIMER 1.87* 1.65* 1.87*  FERRITIN 616*  --   --   LDH 438*  --   --   CRP 17.8* 14.6* 11.0*    No results found for: BNP  Recent Labs  Lab 02/11/20 0105  PROCALCITON 0.15    Lab Results  Component Value Date  SARSCOV2NAA POSITIVE (A) 02/10/2020     Prone/Incentive Spirometry: encouraged  incentive spirometry use 3-4/hour.  AKI: Mild-likely hemodynamically mediated-in the setting of COVID-19 infection, lisinopril/HCTZ use-creatinine seems to have plateaued-avoid nephrotoxic agents-recheck electrolytes tomorrow.  HTN: BP stable without the use of antihypertensives-follow and resume when able.  HLD: Continue statin  Hypothyroidism: Continue levothyroxine  Obesity: Estimated body mass index is 30.11 kg/m as calculated from the following:   Height as of this encounter: 5\' 3"  (1.6 m).   Weight as of this encounter: 77.1 kg.   ABG: No results found for: PHART, PCO2ART, PO2ART, HCO3, TCO2, ACIDBASEDEF, O2SAT  Vent Settings: N/A   Condition - Stable  Family Communication: Son-Jesus Alejandro-[phone] on 1/27  Code Status :  Full Code  Diet :  Diet Order            Diet Heart Room service appropriate? Yes; Fluid consistency: Thin  Diet effective now                  Disposition Plan  :   Status is: Inpatient  Remains inpatient appropriate because:Inpatient level of care appropriate due to severity of illness   Dispo: The patient is from: Home              Anticipated d/c is to: Home              Anticipated d/c date is: 2 days              Patient currently is not medically stable to d/c.   Difficult to place patient No    Barriers to discharge: Hypoxia requiring O2 supplementation/complete 5 days of IV  Remdesivir  Antimicorbials  :    Anti-infectives (From admission, onward)   Start     Dose/Rate Route Frequency Ordered Stop   02/12/20 1000  remdesivir 100 mg in sodium chloride 0.9 % 100 mL IVPB       "Followed by" Linked Group Details   100 mg 200 mL/hr over 30 Minutes Intravenous Daily 02/11/20 0301 02/16/20 0959   02/11/20 0300  remdesivir 200 mg in sodium chloride 0.9% 250 mL IVPB       "Followed by" Linked Group Details   200 mg 580 mL/hr over 30 Minutes Intravenous Once 02/11/20 0301 02/11/20 02/13/20      Inpatient Medications  Scheduled Meds: . albuterol  2 puff Inhalation QID  . vitamin C  500 mg Oral Daily  . atorvastatin  20 mg Oral Daily  . benzonatate  200 mg Oral TID  . enoxaparin (LOVENOX) injection  40 mg Subcutaneous Q24H  . methylPREDNISolone (SOLU-MEDROL) injection  60 mg Intravenous BID  . zinc sulfate  220 mg Oral Daily   Continuous Infusions: . remdesivir 100 mg in NS 100 mL 100 mg (02/12/20 1101)   PRN Meds:.acetaminophen **OR** acetaminophen, chlorpheniramine-HYDROcodone, guaiFENesin-dextromethorphan, hydrALAZINE   Time Spent in minutes  25  See all Orders from today for further details   02/14/20 M.D on 02/12/2020 at 2:44 PM  To page go to [email or web address] - use universal password  [facility] -  Office  [phone]    Objective:   Vitals:   02/12/20 0730 02/12/20 0830 02/12/20 0932 02/12/20 1018  BP: 115/61 127/68 125/60 130/80  Pulse: (!) 47 (!) 49 (!) 49 61  Resp: 17 17 18 18   Temp:    98 F (36.7 C)  TempSrc:    Oral  SpO2: 92% 91% 94% 94%  Weight:  Height:        Wt Readings from Last 3 Encounters:  02/11/20 77.1 kg    No intake or output data in the 24 hours ending 02/12/20 1444   Physical Exam Gen Exam:Alert awake-not in any distress HEENT:atraumatic, normocephalic Chest: B/L clear to auscultation anteriorly CVS:S1S2 regular Abdomen:soft non tender, non distended Extremities:no edema Neurology: Non  focal Skin: no rash   Data Review:    CBC Recent Labs  Lab 02/10/20 1225 02/11/20 0318 02/12/20 0453  WBC 6.3 5.0 8.1  HGB 13.8 12.0 11.7*  HCT 42.7 37.8 34.4*  PLT 226 214 245  MCV 96.4 96.9 95.3  MCH 31.2 30.8 32.4  MCHC 32.3 31.7 34.0  RDW 15.1 14.9 14.6  LYMPHSABS  --   --  1.0  MONOABS  --   --  0.4  EOSABS  --   --  0.0  BASOSABS  --   --  0.0    Chemistries  Recent Labs  Lab 02/10/20 1225 02/11/20 0318 02/11/20 0800 02/12/20 0453  NA 142  --  139 139  K 4.1  --  3.9 4.3  CL 111  --  109 111  CO2 21*  --  18* 19*  GLUCOSE 124*  --  315* 155*  BUN 38*  --  34* 41*  CREATININE 1.66* 1.62* 1.58* 1.57*  CALCIUM 9.0  --  8.1* 8.7*  AST  --   --  79* 62*  ALT  --   --  34 31  ALKPHOS  --   --  126 107  BILITOT  --   --  1.0 1.4*   ------------------------------------------------------------------------------------------------------------------ Recent Labs    02/11/20 0105  TRIG 123    No results found for: HGBA1C ------------------------------------------------------------------------------------------------------------------ No results for input(s): TSH, T4TOTAL, T3FREE, THYROIDAB in the last 72 hours.  Invalid input(s): FREET3 ------------------------------------------------------------------------------------------------------------------ Recent Labs    02/11/20 0105  FERRITIN 616*    Coagulation profile No results for input(s): INR, PROTIME in the last 168 hours.  Recent Labs    02/11/20 0800 02/12/20 0453  DDIMER 1.65* 1.87*    Cardiac Enzymes No results for input(s): CKMB, TROPONINI, MYOGLOBIN in the last 168 hours.  Invalid input(s): CK ------------------------------------------------------------------------------------------------------------------ No results found for: BNP  Micro Results Recent Results (from the past 240 hour(s))  SARS CORONAVIRUS 2 (TAT 6-24 HRS) Nasopharyngeal Nasopharyngeal Swab     Status: Abnormal    Collection Time: 02/10/20 12:24 PM   Specimen: Nasopharyngeal Swab  Result Value Ref Range Status   SARS Coronavirus 2 POSITIVE (A) NEGATIVE Final    Comment: (NOTE) SARS-CoV-2 target nucleic acids are DETECTED.  The SARS-CoV-2 RNA is generally detectable in upper and lower respiratory specimens during the acute phase of infection. Positive results are indicative of the presence of SARS-CoV-2 RNA. Clinical correlation with patient history and other diagnostic information is  necessary to determine patient infection status. Positive results do not rule out bacterial infection or co-infection with other viruses.  The expected result is Negative.  Fact Sheet for Patients: HairSlick.no  Fact Sheet for Healthcare Providers: quierodirigir.com  This test is not yet approved or cleared by the [place] FDA and  has been authorized for detection and/or diagnosis of SARS-CoV-2 by FDA under an Emergency Use Authorization (EUA). This EUA will remain  in effect (meaning this test can be used) for the duration of the COVID-19 declaration under Section 564(b)(1) of the Act, 21 U. S.C. section 360bbb-3(b)(1), unless the authorization is terminated or  revoked sooner.   Performed at [facility], [address] [place], [place] [address]   Blood Culture (routine x 2)     Status: None (Preliminary result)   Collection Time: 02/11/20 12:40 AM   Specimen: BLOOD RIGHT HAND  Result Value Ref Range Status   Specimen Description BLOOD RIGHT HAND  Final   Special Requests   Final    BOTTLES DRAWN AEROBIC AND ANAEROBIC Blood Culture adequate volume   Culture   Final    NO GROWTH 1 DAY Performed at [facility], [address], [place], [place] [address]    Report Status PENDING  Incomplete  Blood Culture (routine x 2)     Status: None (Preliminary result)   Collection Time: 02/11/20 12:45 AM   Specimen: BLOOD  Result Value Ref  Range Status   Specimen Description BLOOD RIGHT ANTECUBITAL  Final   Special Requests   Final    BOTTLES DRAWN AEROBIC AND ANAEROBIC Blood Culture adequate volume   Culture   Final    NO GROWTH 1 DAY Performed at [facility], [address] [place], [place] [address]    Report Status PENDING  Incomplete    Radiology Reports DG Chest Portable 1 View  Result Date: 02/10/2020 CLINICAL DATA:  Cough.  Shortness of breath. EXAM: PORTABLE CHEST 1 VIEW COMPARISON:  No prior. FINDINGS: Heart size normal. Diffuse bilateral interstitial infiltrates most consistent with pneumonitis. No pleural effusion or pneumothorax. Degenerative change thoracic spine. IMPRESSION: Diffuse bilateral interstitial infiltrates most consistent with pneumonitis. Question patient's COVID status. Electronically Signed   By: Maisie Fus  Register   On: 02/10/2020 12:52

## 2020-02-12 NOTE — ED Notes (Signed)
Attempted to call report

## 2020-02-13 LAB — COMPREHENSIVE METABOLIC PANEL
ALT: 31 U/L (ref 0–44)
AST: 55 U/L — ABNORMAL HIGH (ref 15–41)
Albumin: 2.4 g/dL — ABNORMAL LOW (ref 3.5–5.0)
Alkaline Phosphatase: 100 U/L (ref 38–126)
Anion gap: 10 (ref 5–15)
BUN: 52 mg/dL — ABNORMAL HIGH (ref 8–23)
CO2: 16 mmol/L — ABNORMAL LOW (ref 22–32)
Calcium: 8.1 mg/dL — ABNORMAL LOW (ref 8.9–10.3)
Chloride: 114 mmol/L — ABNORMAL HIGH (ref 98–111)
Creatinine, Ser: 1.53 mg/dL — ABNORMAL HIGH (ref 0.44–1.00)
GFR, Estimated: 38 mL/min — ABNORMAL LOW (ref >60)
Glucose, Bld: 164 mg/dL — ABNORMAL HIGH (ref 70–99)
Potassium: 4.4 mmol/L (ref 3.5–5.1)
Sodium: 140 mmol/L (ref 135–145)
Total Bilirubin: 0.7 mg/dL (ref 0.3–1.2)
Total Protein: 6.1 g/dL — ABNORMAL LOW (ref 6.5–8.1)

## 2020-02-13 LAB — CBC WITH DIFFERENTIAL/PLATELET
Abs Immature Granulocytes: 0.08 10*3/uL — ABNORMAL HIGH (ref 0.00–0.07)
Basophils Absolute: 0 10*3/uL (ref 0.0–0.1)
Basophils Relative: 0 %
Eosinophils Absolute: 0 10*3/uL (ref 0.0–0.5)
Eosinophils Relative: 0 %
HCT: 34.1 % — ABNORMAL LOW (ref 36.0–46.0)
Hemoglobin: 11.4 g/dL — ABNORMAL LOW (ref 12.0–15.0)
Immature Granulocytes: 1 %
Lymphocytes Relative: 10 %
Lymphs Abs: 1.1 10*3/uL (ref 0.7–4.0)
MCH: 32.1 pg (ref 26.0–34.0)
MCHC: 33.4 g/dL (ref 30.0–36.0)
MCV: 96.1 fL (ref 80.0–100.0)
Monocytes Absolute: 0.2 10*3/uL (ref 0.1–1.0)
Monocytes Relative: 2 %
Neutro Abs: 9.7 10*3/uL — ABNORMAL HIGH (ref 1.7–7.7)
Neutrophils Relative %: 87 %
Platelets: 286 10*3/uL (ref 150–400)
RBC: 3.55 MIL/uL — ABNORMAL LOW (ref 3.87–5.11)
RDW: 14.8 % (ref 11.5–15.5)
WBC: 11.1 10*3/uL — ABNORMAL HIGH (ref 4.0–10.5)
nRBC: 0 % (ref 0.0–0.2)

## 2020-02-13 LAB — D-DIMER, QUANTITATIVE: D-Dimer, Quant: 1.85 ug/mL-FEU — ABNORMAL HIGH (ref 0.00–0.50)

## 2020-02-13 LAB — C-REACTIVE PROTEIN: CRP: 6.1 mg/dL — ABNORMAL HIGH (ref <1.0)

## 2020-02-13 NOTE — Progress Notes (Addendum)
PROGRESS NOTE                                                                                                                                                                                                             Patient Demographics:    Amanda Blanchard, is a 65 y.o. female, DOB - 09-Jun-1955, XTG:[id number]  Outpatient Primary MD for the patient is Patient, No Pcp Per   Admit date - 02/10/2020   LOS - 2  Chief Complaint  Patient presents with  . Cough  . Headache  . Chest Pain       Brief Narrative: Patient is a 65 y.o. female with PMHx of HTN, HLD-presented to the hospital with worsening shortness of breath/cough-ongoing for 2 weeks but worsening over the past few days-found to have acute hypoxic respiratory failure due to COVID-19 pneumonia.  COVID-19 vaccinated status: Unvaccinated  Significant Events: 1/25>> Admit to [facility] for hypoxia due to COVID-19 pneumonia  Significant studies: 1/25>>Chest x-ray:Diffuse bilateral infiltrates  COVID-19 medications: Steroids: 1/25>> Remdesivir: 1/25>>  Antibiotics: None  Microbiology data: 1/26 >>blood culture:no growth  Procedures: None  Consults: None  DVT prophylaxis: enoxaparin (LOVENOX) injection 40 mg Start: 02/11/20 0600    Subjective:   Slowly improving-some anxiety component-Down to 2-3 L of oxygen-   Assessment  & Plan :   Acute Hypoxic Resp Failure due to Covid 19 Viral pneumonia: Improved-was on 4 L on initial presentation-decrease down to 2-2 L today.  Stable continues to complain of shortness of breath with ambulation.  CRP downtrending.  Continue steroid/Remdesivir.  In the unlikely event she worsens-she has consented to the use of Actemra.    Note-rationale/risk/benefits of Actemra discussed with patient-she has no history of TB, diverticulitis, hep B-understands the risk of opportunistic/bacterial infections-and consents to the use  of Actemra.  Fever: afebrile O2 requirements:  SpO2: 92 % O2 Flow Rate (L/min): 3 L/min   COVID-19 Labs: Recent Labs    02/11/20 0105 02/11/20 0800 02/12/20 0453 02/13/20 0223  DDIMER 1.87* 1.65* 1.87* 1.85*  FERRITIN 616*  --   --   --   LDH 438*  --   --   --   CRP 17.8* 14.6* 11.0* 6.1*    No results found for: BNP  Recent Labs  Lab 02/11/20 0105  PROCALCITON 0.15    Lab Results  Component Value Date   SARSCOV2NAA POSITIVE (A) 02/10/2020     Prone/Incentive Spirometry: encouraged  incentive spirometry use 3-4/hour.  AKI: Mild-likely hemodynamically mediated-in the setting of COVID-19 infection, lisinopril/HCTZ use-creatinine seems to have plateaued-an slowly downtrending-volume status is stable-do not think she requires IV fluids-avoid nephrotoxic agents-recheck electrolytes tomorrow.  HTN: BP stable without the use of antihypertensives-follow and resume when able.  HLD: Continue statin  Hypothyroidism: Apparently takes levothyroxine-but does not know the dose.  Obesity: Estimated body mass index is 30.11 kg/m as calculated from the following:   Height as of this encounter: 5\' 3"  (1.6 m).   Weight as of this encounter: 77.1 kg.   ABG: No results found for: PHART, PCO2ART, PO2ART, HCO3, TCO2, ACIDBASEDEF, O2SAT  Vent Settings: N/A   Condition - Stable  Family Communication: Son-Jesus Alejandro-[phone] on 1/28  Code Status :  Full Code  Diet :  Diet Order            Diet Heart Room service appropriate? Yes; Fluid consistency: Thin  Diet effective now                  Disposition Plan  :   Status is: Inpatient  Remains inpatient appropriate because:Inpatient level of care appropriate due to severity of illness   Dispo: The patient is from: Home              Anticipated d/c is to: Home              Anticipated d/c date is: 2 days              Patient currently is not medically stable to d/c.   Difficult to place patient  No    Barriers to discharge: Hypoxia requiring O2 supplementation/complete 5 days of IV Remdesivir  Antimicorbials  :    Anti-infectives (From admission, onward)   Start     Dose/Rate Route Frequency Ordered Stop   02/12/20 1000  remdesivir 100 mg in sodium chloride 0.9 % 100 mL IVPB       "Followed by" Linked Group Details   100 mg 200 mL/hr over 30 Minutes Intravenous Daily 02/11/20 0301 02/16/20 0959   02/11/20 0300  remdesivir 200 mg in sodium chloride 0.9% 250 mL IVPB       "Followed by" Linked Group Details   200 mg 580 mL/hr over 30 Minutes Intravenous Once 02/11/20 0301 02/11/20 3570      Inpatient Medications  Scheduled Meds: . albuterol  2 puff Inhalation QID  . vitamin C  500 mg Oral Daily  . atorvastatin  20 mg Oral Daily  . benzonatate  200 mg Oral TID  . enoxaparin (LOVENOX) injection  40 mg Subcutaneous Q24H  . methylPREDNISolone (SOLU-MEDROL) injection  60 mg Intravenous BID  . zinc sulfate  220 mg Oral Daily   Continuous Infusions: . remdesivir 100 mg in NS 100 mL 100 mg (02/13/20 0930)   PRN Meds:.acetaminophen **OR** acetaminophen, chlorpheniramine-HYDROcodone, guaiFENesin-dextromethorphan, hydrALAZINE   Time Spent in minutes  25  See all Orders from today for further details   Jeoffrey Massed M.D on 02/13/2020 at 2:13 PM  To page go to [email or web address] - use universal password  [facility] -  Office  [phone]    Objective:   Vitals:   02/12/20 2045 02/12/20 2105 02/13/20 0552 02/13/20 1300  BP:  (!) 115/57 119/72 126/66  Pulse: 62 64 (!) 50 61  Resp: 20 20 18 17   Temp:  97.9 F (36.6 C)  97.8 F (36.6 C) 97.7 F (36.5 C)  TempSrc:  Oral Oral Oral  SpO2:  94% 96% 92%  Weight:      Height:        Wt Readings from Last 3 Encounters:  02/11/20 77.1 kg     Intake/Output Summary (Last 24 hours) at 02/13/2020 1413 Last data filed at 02/13/2020 1300 Gross per 24 hour  Intake 120 ml  Output --  Net 120 ml     Physical  Exam Gen Exam:Alert awake-not in any distress HEENT:atraumatic, normocephalic Chest: B/L clear to auscultation anteriorly CVS:S1S2 regular Abdomen:soft non tender, non distended Extremities:no edema Neurology: Non focal Skin: no rash   Data Review:    CBC Recent Labs  Lab 02/10/20 1225 02/11/20 0318 02/12/20 0453 02/13/20 0223  WBC 6.3 5.0 8.1 11.1*  HGB 13.8 12.0 11.7* 11.4*  HCT 42.7 37.8 34.4* 34.1*  PLT 226 214 245 286  MCV 96.4 96.9 95.3 96.1  MCH 31.2 30.8 32.4 32.1  MCHC 32.3 31.7 34.0 33.4  RDW 15.1 14.9 14.6 14.8  LYMPHSABS  --   --  1.0 1.1  MONOABS  --   --  0.4 0.2  EOSABS  --   --  0.0 0.0  BASOSABS  --   --  0.0 0.0    Chemistries  Recent Labs  Lab 02/10/20 1225 02/11/20 0318 02/11/20 0800 02/12/20 0453 02/13/20 0223  NA 142  --  139 139 140  K 4.1  --  3.9 4.3 4.4  CL 111  --  109 111 114*  CO2 21*  --  18* 19* 16*  GLUCOSE 124*  --  315* 155* 164*  BUN 38*  --  34* 41* 52*  CREATININE 1.66* 1.62* 1.58* 1.57* 1.53*  CALCIUM 9.0  --  8.1* 8.7* 8.1*  AST  --   --  79* 62* 55*  ALT  --   --  34 31 31  ALKPHOS  --   --  126 107 100  BILITOT  --   --  1.0 1.4* 0.7   ------------------------------------------------------------------------------------------------------------------ Recent Labs    02/11/20 0105  TRIG 123    No results found for: HGBA1C ------------------------------------------------------------------------------------------------------------------ No results for input(s): TSH, T4TOTAL, T3FREE, THYROIDAB in the last 72 hours.  Invalid input(s): FREET3 ------------------------------------------------------------------------------------------------------------------ Recent Labs    02/11/20 0105  FERRITIN 616*    Coagulation profile No results for input(s): INR, PROTIME in the last 168 hours.  Recent Labs    02/12/20 0453 02/13/20 0223  DDIMER 1.87* 1.85*    Cardiac Enzymes No results for input(s): CKMB,  TROPONINI, MYOGLOBIN in the last 168 hours.  Invalid input(s): CK ------------------------------------------------------------------------------------------------------------------ No results found for: BNP  Micro Results Recent Results (from the past 240 hour(s))  SARS CORONAVIRUS 2 (TAT 6-24 HRS) Nasopharyngeal Nasopharyngeal Swab     Status: Abnormal   Collection Time: 02/10/20 12:24 PM   Specimen: Nasopharyngeal Swab  Result Value Ref Range Status   SARS Coronavirus 2 POSITIVE (A) NEGATIVE Final    Comment: (NOTE) SARS-CoV-2 target nucleic acids are DETECTED.  The SARS-CoV-2 RNA is generally detectable in upper and lower respiratory specimens during the acute phase of infection. Positive results are indicative of the presence of SARS-CoV-2 RNA. Clinical correlation with patient history and other diagnostic information is  necessary to determine patient infection status. Positive results do not rule out bacterial infection or co-infection with other viruses.  The expected result is Negative.  Fact Sheet for Patients: HairSlick.no  Fact  Sheet for Healthcare Providers: quierodirigir.com  This test is not yet approved or cleared by the [place] FDA and  has been authorized for detection and/or diagnosis of SARS-CoV-2 by FDA under an Emergency Use Authorization (EUA). This EUA will remain  in effect (meaning this test can be used) for the duration of the COVID-19 declaration under Section 564(b)(1) of the Act, 21 U. S.C. section 360bbb-3(b)(1), unless the authorization is terminated or revoked sooner.   Performed at [facility], [address], [place], [place] [address]   Blood Culture (routine x 2)     Status: None (Preliminary result)   Collection Time: 02/11/20 12:40 AM   Specimen: BLOOD RIGHT HAND  Result Value Ref Range Status   Specimen Description BLOOD RIGHT HAND  Final   Special Requests   Final     BOTTLES DRAWN AEROBIC AND ANAEROBIC Blood Culture adequate volume   Culture   Final    NO GROWTH 2 DAYS Performed at [facility], [address] [place], [place] [address]    Report Status PENDING  Incomplete  Blood Culture (routine x 2)     Status: None (Preliminary result)   Collection Time: 02/11/20 12:45 AM   Specimen: BLOOD  Result Value Ref Range Status   Specimen Description BLOOD RIGHT ANTECUBITAL  Final   Special Requests   Final    BOTTLES DRAWN AEROBIC AND ANAEROBIC Blood Culture adequate volume   Culture   Final    NO GROWTH 2 DAYS Performed at [facility], [address], [place], [place] [address]    Report Status PENDING  Incomplete    Radiology Reports DG Chest Portable 1 View  Result Date: 02/10/2020 CLINICAL DATA:  Cough.  Shortness of breath. EXAM: PORTABLE CHEST 1 VIEW COMPARISON:  No prior. FINDINGS: Heart size normal. Diffuse bilateral interstitial infiltrates most consistent with pneumonitis. No pleural effusion or pneumothorax. Degenerative change thoracic spine. IMPRESSION: Diffuse bilateral interstitial infiltrates most consistent with pneumonitis. Question patient's COVID status. Electronically Signed   By: Maisie Fus  Register   On: 02/10/2020 12:52

## 2020-02-14 ENCOUNTER — Inpatient Hospital Stay (HOSPITAL_COMMUNITY): Payer: HRSA Program

## 2020-02-14 LAB — CBC WITH DIFFERENTIAL/PLATELET
Abs Immature Granulocytes: 0.13 10*3/uL — ABNORMAL HIGH (ref 0.00–0.07)
Basophils Absolute: 0 10*3/uL (ref 0.0–0.1)
Basophils Relative: 0 %
Eosinophils Absolute: 0 10*3/uL (ref 0.0–0.5)
Eosinophils Relative: 0 %
HCT: 33.4 % — ABNORMAL LOW (ref 36.0–46.0)
Hemoglobin: 11.4 g/dL — ABNORMAL LOW (ref 12.0–15.0)
Immature Granulocytes: 1 %
Lymphocytes Relative: 10 %
Lymphs Abs: 0.9 10*3/uL (ref 0.7–4.0)
MCH: 31.7 pg (ref 26.0–34.0)
MCHC: 34.1 g/dL (ref 30.0–36.0)
MCV: 92.8 fL (ref 80.0–100.0)
Monocytes Absolute: 0.3 10*3/uL (ref 0.1–1.0)
Monocytes Relative: 3 %
Neutro Abs: 8.1 10*3/uL — ABNORMAL HIGH (ref 1.7–7.7)
Neutrophils Relative %: 86 %
Platelets: 290 10*3/uL (ref 150–400)
RBC: 3.6 MIL/uL — ABNORMAL LOW (ref 3.87–5.11)
RDW: 14.6 % (ref 11.5–15.5)
WBC: 9.5 10*3/uL (ref 4.0–10.5)
nRBC: 0 % (ref 0.0–0.2)

## 2020-02-14 LAB — CREATININE, URINE, RANDOM: Creatinine, Urine: 110.61 mg/dL

## 2020-02-14 LAB — BRAIN NATRIURETIC PEPTIDE: B Natriuretic Peptide: 298 pg/mL — ABNORMAL HIGH (ref 0.0–100.0)

## 2020-02-14 LAB — URINALYSIS, ROUTINE W REFLEX MICROSCOPIC
Bacteria, UA: NONE SEEN
Bilirubin Urine: NEGATIVE
Glucose, UA: 150 mg/dL — AB
Hgb urine dipstick: NEGATIVE
Ketones, ur: NEGATIVE mg/dL
Nitrite: NEGATIVE
Protein, ur: NEGATIVE mg/dL
Specific Gravity, Urine: 1.028 (ref 1.005–1.030)
pH: 5 (ref 5.0–8.0)

## 2020-02-14 LAB — COMPREHENSIVE METABOLIC PANEL
ALT: 28 U/L (ref 0–44)
AST: 44 U/L — ABNORMAL HIGH (ref 15–41)
Albumin: 2.4 g/dL — ABNORMAL LOW (ref 3.5–5.0)
Alkaline Phosphatase: 111 U/L (ref 38–126)
Anion gap: 9 (ref 5–15)
BUN: 48 mg/dL — ABNORMAL HIGH (ref 8–23)
CO2: 18 mmol/L — ABNORMAL LOW (ref 22–32)
Calcium: 8.2 mg/dL — ABNORMAL LOW (ref 8.9–10.3)
Chloride: 113 mmol/L — ABNORMAL HIGH (ref 98–111)
Creatinine, Ser: 1.49 mg/dL — ABNORMAL HIGH (ref 0.44–1.00)
GFR, Estimated: 39 mL/min — ABNORMAL LOW (ref >60)
Glucose, Bld: 169 mg/dL — ABNORMAL HIGH (ref 70–99)
Potassium: 4 mmol/L (ref 3.5–5.1)
Sodium: 140 mmol/L (ref 135–145)
Total Bilirubin: 0.8 mg/dL (ref 0.3–1.2)
Total Protein: 6 g/dL — ABNORMAL LOW (ref 6.5–8.1)

## 2020-02-14 LAB — C-REACTIVE PROTEIN: CRP: 3.6 mg/dL — ABNORMAL HIGH (ref <1.0)

## 2020-02-14 LAB — OSMOLALITY: Osmolality: 320 mOsm/kg — ABNORMAL HIGH (ref 275–295)

## 2020-02-14 LAB — D-DIMER, QUANTITATIVE: D-Dimer, Quant: 1.44 ug/mL-FEU — ABNORMAL HIGH (ref 0.00–0.50)

## 2020-02-14 LAB — MAGNESIUM: Magnesium: 2.6 mg/dL — ABNORMAL HIGH (ref 1.7–2.4)

## 2020-02-14 LAB — OSMOLALITY, URINE: Osmolality, Ur: 913 mOsm/kg — ABNORMAL HIGH (ref 300–900)

## 2020-02-14 LAB — SODIUM, URINE, RANDOM: Sodium, Ur: <10 mmol/L

## 2020-02-14 MED ORDER — METHYLPREDNISOLONE SODIUM SUCC 40 MG IJ SOLR
40.0000 mg | Freq: Two times a day (BID) | INTRAMUSCULAR | Status: DC
  Administered 2020-02-14: 40 mg via INTRAVENOUS
  Filled 2020-02-14: qty 1

## 2020-02-14 NOTE — Progress Notes (Signed)
PT Cancellation Note  Patient Details Name: Amanda Blanchard MRN: [id number] DOB: Aug 08, 1955   Cancelled Treatment:    Reason Eval/Treat Not Completed: Fatigue/lethargy limiting ability to participate. Pt declining participation in PT eval due to just having returned to bed from bathroom. Encouragement provided but pt waving therapist away, stating "no walk, no walk. I want to rest now." PT to re-attempt as time allows.   [name] 02/14/2020, 1:02 PM   Aida Raider, PT  Office # [phone] Pager [phone]

## 2020-02-14 NOTE — Progress Notes (Signed)
PROGRESS NOTE                                                                                                                                                                                                             Patient Demographics:    Amanda Blanchard, is a 65 y.o. female, DOB - Jan 15, 1956, PJA:[id number]  Outpatient Primary MD for the patient is Patient, No Pcp Per   Admit date - 02/10/2020   LOS - 3  Chief Complaint  Patient presents with  . Cough  . Headache  . Chest Pain       Brief Narrative: Patient is a 65 y.o. female with PMHx of HTN, HLD-presented to the hospital with worsening shortness of breath/cough-ongoing for 2 weeks but worsening over the past few days-found to have acute hypoxic respiratory failure due to COVID-19 pneumonia.  COVID-19 vaccinated status: Unvaccinated  Significant Events: 1/25>> Admit to [facility] for hypoxia due to COVID-19 pneumonia  Significant studies: 1/25>>Chest x-ray:Diffuse bilateral infiltrates  COVID-19 medications: Steroids: 1/25>> Remdesivir: 1/25>>  Antibiotics: None  Microbiology data: 1/26 >>blood culture:no growth  Procedures: None  Consults: None  DVT prophylaxis: enoxaparin (LOVENOX) injection 40 mg Start: 02/11/20 0600    Subjective:   Patient in bed, appears comfortable, denies any headache, no fever, no chest pain or pressure, improved shortness of breath , no abdominal pain. No focal weakness.   Assessment  & Plan :   Acute Hypoxic Resp Failure due to Covid 19 Viral pneumonia: Improved-was on 4 L on initial presentation-decrease down to 2-2 L today.  Stable continues to complain of shortness of breath with ambulation.  CRP downtrending.  Continue steroid/Remdesivir.  In the unlikely event she worsens-she has consented to the use of Actemra.    O2 requirements:  SpO2: 98 % (Simultaneous filing. User may not have seen previous data.) O2  Flow Rate (L/min): 2 L/min   Prone/Incentive Spirometry: encouraged  incentive spirometry use 3-4/hour.     Recent Labs  Lab 02/10/20 1224 02/10/20 1225 02/11/20 0040 02/11/20 0105 02/11/20 0318 02/11/20 0800 02/12/20 0453 02/13/20 0223 02/14/20 0459 02/14/20 0728  WBC  --  6.3  --   --  5.0  --  8.1 11.1* 9.5  --   HGB  --  13.8  --   --  12.0  --  11.7* 11.4*  11.4*  --   HCT  --  42.7  --   --  37.8  --  34.4* 34.1* 33.4*  --   PLT  --  226  --   --  214  --  245 286 290  --   CRP  --   --   --  17.8*  --  14.6* 11.0* 6.1* 3.6*  --   BNP  --   --   --   --   --   --   --   --   --  298.0*  DDIMER  --   --   --  1.87*  --  1.65* 1.87* 1.85* 1.44*  --   PROCALCITON  --   --   --  0.15  --   --   --   --   --   --   AST  --   --   --   --   --  79* 62* 55* 44*  --   ALT  --   --   --   --   --  34 31 31 28   --   ALKPHOS  --   --   --   --   --  126 107 100 111  --   BILITOT  --   --   --   --   --  1.0 1.4* 0.7 0.8  --   ALBUMIN  --   --   --   --   --  2.6* 2.4* 2.4* 2.4*  --   LATICACIDVEN  --   --  1.7  --   --   --   --   --   --   --   SARSCOV2NAA POSITIVE*  --   --   --   --   --   --   --   --   --      AKI: Mild-likely hemodynamically mediated-in the setting of COVID-19 infection, lisinopril/HCTZ use-creatinine seems to have plateaued-an slowly downtrending-volume status is stable-do not think she requires IV fluids-avoid nephrotoxic agents-recheck electrolytes tomorrow.  However her baseline creatinine not known no previous creatinine available in the system could have CKD 3 a baseline.  Check UA & .  HTN: BP stable without the use of antihypertensives-follow and resume when able.  HLD: Continue statin  Hypothyroidism: Apparently takes levothyroxine-but does not know the dose.  Obesity: Estimated body mass index is 30.11 kg/m as calculated from the following:   Height as of this encounter: 5\' 3"  (1.6 m).   Weight as of this encounter: 77.1 kg.    Condition -  Stable  Family Communication: Son-Jesus Alejandro-[phone] on 1/28  Code Status :  Full Code  Diet :  Diet Order            Diet Heart Room service appropriate? Yes; Fluid consistency: Thin  Diet effective now                  Disposition Plan  :  Status is: Inpatient  Remains inpatient appropriate because:Inpatient level of care appropriate due to severity of illness   Dispo: The patient is from: Home              Anticipated d/c is to: Home              Anticipated d/c date is: 2 days  Patient currently is not medically stable to d/c.   Difficult to place patient No    Barriers to discharge: Hypoxia requiring O2 supplementation/complete 5 days of IV Remdesivir  Antimicorbials  :    Anti-infectives (From admission, onward)   Start     Dose/Rate Route Frequency Ordered Stop   02/12/20 1000  remdesivir 100 mg in sodium chloride 0.9 % 100 mL IVPB       "Followed by" Linked Group Details   100 mg 200 mL/hr over 30 Minutes Intravenous Daily 02/11/20 0301 02/16/20 0959   02/11/20 0300  remdesivir 200 mg in sodium chloride 0.9% 250 mL IVPB       "Followed by" Linked Group Details   200 mg 580 mL/hr over 30 Minutes Intravenous Once 02/11/20 0301 02/11/20 [id number]      Inpatient Medications  Scheduled Meds: . albuterol  2 puff Inhalation QID  . vitamin C  500 mg Oral Daily  . atorvastatin  20 mg Oral Daily  . benzonatate  200 mg Oral TID  . enoxaparin (LOVENOX) injection  40 mg Subcutaneous Q24H  . methylPREDNISolone (SOLU-MEDROL) injection  40 mg Intravenous BID  . zinc sulfate  220 mg Oral Daily   Continuous Infusions: . remdesivir 100 mg in NS 100 mL 100 mg (02/14/20 0825)   PRN Meds:.acetaminophen **OR** [DISCONTINUED] acetaminophen, chlorpheniramine-HYDROcodone, guaiFENesin-dextromethorphan, hydrALAZINE   Time Spent in minutes  25  See all Orders from today for further details   Susa RaringPrashant Singh M.D on 02/14/2020 at 12:00 PM  To page go to  [email or web address] - use universal password  [facility] -  Office  [phone]    Objective:   Vitals:   02/13/20 0552 02/13/20 1300 02/13/20 1949 02/14/20 0603  BP: 119/72 126/66 115/68 128/63  Pulse: (!) 50 61 (!) 57 (!) 46  Resp: 18 17 20 18   Temp: 97.8 F (36.6 C) 97.7 F (36.5 C) (!) 97.5 F (36.4 C) 97.6 F (36.4 C)  TempSrc: Oral Oral Oral Oral  SpO2: 96% 92% 92% 98%  Weight:      Height:        Wt Readings from Last 3 Encounters:  02/11/20 77.1 kg     Intake/Output Summary (Last 24 hours) at 02/14/2020 1200 Last data filed at 02/14/2020 0515 Gross per 24 hour  Intake 240 ml  Output -  Net 240 ml     Physical Exam  Awake Alert, No new F.N deficits, Normal affect [place].AT,PERRAL Supple Neck,No JVD, No cervical lymphadenopathy appriciated.  Symmetrical Chest wall movement, Good air movement bilaterally, CTAB RRR,No Gallops, Rubs or new Murmurs, No Parasternal Heave +ve B.Sounds, Abd Soft, No tenderness, No organomegaly appriciated, No rebound - guarding or rigidity. No Cyanosis, Clubbing or edema, No new Rash or bruise    Data Review:    CBC Recent Labs  Lab 02/10/20 1225 02/11/20 0318 02/12/20 0453 02/13/20 0223 02/14/20 0459  WBC 6.3 5.0 8.1 11.1* 9.5  HGB 13.8 12.0 11.7* 11.4* 11.4*  HCT 42.7 37.8 34.4* 34.1* 33.4*  PLT 226 214 245 286 290  MCV 96.4 96.9 95.3 96.1 92.8  MCH 31.2 30.8 32.4 32.1 31.7  MCHC 32.3 31.7 34.0 33.4 34.1  RDW 15.1 14.9 14.6 14.8 14.6  LYMPHSABS  --   --  1.0 1.1 0.9  MONOABS  --   --  0.4 0.2 0.3  EOSABS  --   --  0.0 0.0 0.0  BASOSABS  --   --  0.0 0.0 0.0    Chemistries  Recent Labs  Lab 02/10/20 1225 02/11/20 0318 02/11/20 0800 02/12/20 0453 02/13/20 0223 02/14/20 0459 02/14/20 0728  NA 142  --  139 139 140 140  --   K 4.1  --  3.9 4.3 4.4 4.0  --   CL 111  --  109 111 114* 113*  --   CO2 21*  --  18* 19* 16* 18*  --   GLUCOSE 124*  --  315* 155* 164* 169*  --   BUN 38*  --  34* 41* 52* 48*  --    CREATININE 1.66* 1.62* 1.58* 1.57* 1.53* 1.49*  --   CALCIUM 9.0  --  8.1* 8.7* 8.1* 8.2*  --   MG  --   --   --   --   --   --  2.6*  AST  --   --  79* 62* 55* 44*  --   ALT  --   --  34 31 31 28   --   ALKPHOS  --   --  126 107 100 111  --   BILITOT  --   --  1.0 1.4* 0.7 0.8  --    ------------------------------------------------------------------------------------------------------------------ No results for input(s): CHOL, HDL, LDLCALC, TRIG, CHOLHDL, LDLDIRECT in the last 72 hours.  No results found for: HGBA1C ------------------------------------------------------------------------------------------------------------------ No results for input(s): TSH, T4TOTAL, T3FREE, THYROIDAB in the last 72 hours.  Invalid input(s): FREET3 ------------------------------------------------------------------------------------------------------------------ No results for input(s): VITAMINB12, FOLATE, FERRITIN, TIBC, IRON, RETICCTPCT in the last 72 hours.  Coagulation profile No results for input(s): INR, PROTIME in the last 168 hours.  Recent Labs    02/13/20 0223 02/14/20 0459  DDIMER 1.85* 1.44*    Cardiac Enzymes No results for input(s): CKMB, TROPONINI, MYOGLOBIN in the last 168 hours.  Invalid input(s): CK ------------------------------------------------------------------------------------------------------------------    Component Value Date/Time   BNP 298.0 (H) 02/14/2020 02/16/2020    Micro Results Recent Results (from the past 240 hour(s))  SARS CORONAVIRUS 2 (TAT 6-24 HRS) Nasopharyngeal Nasopharyngeal Swab     Status: Abnormal   Collection Time: 02/10/20 12:24 PM   Specimen: Nasopharyngeal Swab  Result Value Ref Range Status   SARS Coronavirus 2 POSITIVE (A) NEGATIVE Final    Comment: (NOTE) SARS-CoV-2 target nucleic acids are DETECTED.  The SARS-CoV-2 RNA is generally detectable in upper and lower respiratory specimens during the acute phase of infection.  Positive results are indicative of the presence of SARS-CoV-2 RNA. Clinical correlation with patient history and other diagnostic information is  necessary to determine patient infection status. Positive results do not rule out bacterial infection or co-infection with other viruses.  The expected result is Negative.  Fact Sheet for Patients: 02/12/20  Fact Sheet for Healthcare Providers: HairSlick.no  This test is not yet approved or cleared by the [place] FDA and  has been authorized for detection and/or diagnosis of SARS-CoV-2 by FDA under an Emergency Use Authorization (EUA). This EUA will remain  in effect (meaning this test can be used) for the duration of the COVID-19 declaration under Section 564(b)(1) of the Act, 21 U. S.C. section 360bbb-3(b)(1), unless the authorization is terminated or revoked sooner.   Performed at [facility], [address] [place], [place]   Blood Culture (routine x 2)     Status: None (Preliminary result)   Collection Time: 02/11/20 12:40 AM   Specimen: BLOOD RIGHT HAND  Result Value Ref Range Status   Specimen Description BLOOD RIGHT HAND  Final  Special Requests   Final    BOTTLES DRAWN AEROBIC AND ANAEROBIC Blood Culture adequate volume   Culture   Final    NO GROWTH 2 DAYS Performed at [facility], [address] [place], [place] [address]    Report Status PENDING  Incomplete  Blood Culture (routine x 2)     Status: None (Preliminary result)   Collection Time: 02/11/20 12:45 AM   Specimen: BLOOD  Result Value Ref Range Status   Specimen Description BLOOD RIGHT ANTECUBITAL  Final   Special Requests   Final    BOTTLES DRAWN AEROBIC AND ANAEROBIC Blood Culture adequate volume   Culture   Final    NO GROWTH 2 DAYS Performed at [facility], [address] [place], [place] [address]    Report Status PENDING  Incomplete    Radiology  Reports DG Chest Portable 1 View  Result Date: 02/10/2020 CLINICAL DATA:  Cough.  Shortness of breath. EXAM: PORTABLE CHEST 1 VIEW COMPARISON:  No prior. FINDINGS: Heart size normal. Diffuse bilateral interstitial infiltrates most consistent with pneumonitis. No pleural effusion or pneumothorax. Degenerative change thoracic spine. IMPRESSION: Diffuse bilateral interstitial infiltrates most consistent with pneumonitis. Question patient's COVID status. Electronically Signed   By: Maisie Fus  Register   On: 02/10/2020 12:52

## 2020-02-15 ENCOUNTER — Inpatient Hospital Stay (HOSPITAL_COMMUNITY): Payer: HRSA Program

## 2020-02-15 LAB — CBC WITH DIFFERENTIAL/PLATELET
Abs Immature Granulocytes: 0.18 10*3/uL — ABNORMAL HIGH (ref 0.00–0.07)
Basophils Absolute: 0 10*3/uL (ref 0.0–0.1)
Basophils Relative: 0 %
Eosinophils Absolute: 0 10*3/uL (ref 0.0–0.5)
Eosinophils Relative: 0 %
HCT: 34.9 % — ABNORMAL LOW (ref 36.0–46.0)
Hemoglobin: 11.4 g/dL — ABNORMAL LOW (ref 12.0–15.0)
Immature Granulocytes: 2 %
Lymphocytes Relative: 8 %
Lymphs Abs: 0.8 10*3/uL (ref 0.7–4.0)
MCH: 31 pg (ref 26.0–34.0)
MCHC: 32.7 g/dL (ref 30.0–36.0)
MCV: 94.8 fL (ref 80.0–100.0)
Monocytes Absolute: 0.5 10*3/uL (ref 0.1–1.0)
Monocytes Relative: 5 %
Neutro Abs: 8.8 10*3/uL — ABNORMAL HIGH (ref 1.7–7.7)
Neutrophils Relative %: 85 %
Platelets: 284 10*3/uL (ref 150–400)
RBC: 3.68 MIL/uL — ABNORMAL LOW (ref 3.87–5.11)
RDW: 14.5 % (ref 11.5–15.5)
WBC: 10.4 10*3/uL (ref 4.0–10.5)
nRBC: 0 % (ref 0.0–0.2)

## 2020-02-15 LAB — COMPREHENSIVE METABOLIC PANEL
ALT: 24 U/L (ref 0–44)
AST: 33 U/L (ref 15–41)
Albumin: 2.2 g/dL — ABNORMAL LOW (ref 3.5–5.0)
Alkaline Phosphatase: 104 U/L (ref 38–126)
Anion gap: 8 (ref 5–15)
BUN: 48 mg/dL — ABNORMAL HIGH (ref 8–23)
CO2: 19 mmol/L — ABNORMAL LOW (ref 22–32)
Calcium: 8.1 mg/dL — ABNORMAL LOW (ref 8.9–10.3)
Chloride: 112 mmol/L — ABNORMAL HIGH (ref 98–111)
Creatinine, Ser: 1.46 mg/dL — ABNORMAL HIGH (ref 0.44–1.00)
GFR, Estimated: 40 mL/min — ABNORMAL LOW (ref >60)
Glucose, Bld: 192 mg/dL — ABNORMAL HIGH (ref 70–99)
Potassium: 4.3 mmol/L (ref 3.5–5.1)
Sodium: 139 mmol/L (ref 135–145)
Total Bilirubin: 0.7 mg/dL (ref 0.3–1.2)
Total Protein: 5.5 g/dL — ABNORMAL LOW (ref 6.5–8.1)

## 2020-02-15 LAB — MAGNESIUM: Magnesium: 2.3 mg/dL (ref 1.7–2.4)

## 2020-02-15 LAB — D-DIMER, QUANTITATIVE: D-Dimer, Quant: 1.24 ug/mL-FEU — ABNORMAL HIGH (ref 0.00–0.50)

## 2020-02-15 LAB — C-REACTIVE PROTEIN: CRP: 2.2 mg/dL — ABNORMAL HIGH (ref <1.0)

## 2020-02-15 LAB — BRAIN NATRIURETIC PEPTIDE: B Natriuretic Peptide: 242.9 pg/mL — ABNORMAL HIGH (ref 0.0–100.0)

## 2020-02-15 MED ORDER — ENOXAPARIN SODIUM 40 MG/0.4ML ~~LOC~~ SOLN
40.0000 mg | Freq: Two times a day (BID) | SUBCUTANEOUS | Status: DC
  Administered 2020-02-15 – 2020-02-16 (×2): 40 mg via SUBCUTANEOUS
  Filled 2020-02-15 (×2): qty 0.4

## 2020-02-15 MED ORDER — METHYLPREDNISOLONE SODIUM SUCC 40 MG IJ SOLR
40.0000 mg | Freq: Every day | INTRAMUSCULAR | Status: DC
  Administered 2020-02-15 – 2020-02-16 (×2): 40 mg via INTRAVENOUS
  Filled 2020-02-15 (×2): qty 1

## 2020-02-15 MED ORDER — LACTATED RINGERS IV SOLN: INTRAVENOUS | Status: AC

## 2020-02-15 MED ORDER — ALBUTEROL SULFATE HFA 108 (90 BASE) MCG/ACT IN AERS
2.0000 | INHALATION_SPRAY | RESPIRATORY_TRACT | Status: DC | PRN
  Filled 2020-02-15: qty 6.7

## 2020-02-15 NOTE — Evaluation (Signed)
Physical Therapy Evaluation Patient Details Name: Amanda Blanchard MRN: [id number] DOB: 1955-12-06 Today's Date: 02/15/2020   History of Present Illness  Patient is a 65 y.o. female with PMHx of HTN, HLD-presented to the hospital with worsening shortness of breath/cough-ongoing for 2 weeks but worsening over the past few days-found to have acute hypoxic respiratory failure due to COVID-19 pneumonia.  Clinical Impression   Pt admitted with above diagnosis. Comes from home where she lives with family; independent prior to admission; Presents to PT overall moving well, but with a tendency to desat with activity, and notable decr activity tolerance;  Pt currently with functional limitations due to the deficits listed below (see PT Problem List). Pt will benefit from skilled PT to increase their independence and safety with mobility to allow discharge to the venue listed below.    Plan for another O2 sat qualifying walk next session.     Follow Up Recommendations No PT follow up    Equipment Recommendations  Other (comment) (likely will not need, but may consider Rollator)    Recommendations for Other Services       Precautions / Restrictions Precautions Precautions: Other (comment) (Covid) Precaution Comments: watch O2 sats      Mobility  Bed Mobility Overal bed mobility: Modified Independent                  Transfers Overall transfer level: Needs assistance Equipment used: None Transfers: Sit to/from Stand Sit to Stand: Supervision         General transfer comment: Cues to self-monitor for activity tolerance  Ambulation/Gait Ambulation/Gait assistance: Supervision Gait Distance (Feet): 100 Feet (50, standing rest break leaning against wall, then 50 back to room) Assistive device: None Gait Pattern/deviations: Step-through pattern Gait velocity: slowed   General Gait Details: Cues to self-monitor for activity tolerance; Walked on room air, and noted a few  different occasions when her O2 sats decr to below 88%, lowest observed briefly 85%; incr back to 88-91% with standing rest break and focused breathing; did not require supplemental O2 to get back to above 88%  Stairs            Wheelchair Mobility    Modified Rankin (Stroke Patients Only)       Balance Overall balance assessment: No apparent balance deficits (not formally assessed)                                           Pertinent Vitals/Pain Pain Assessment: No/denies pain    Home Living Family/patient expects to be discharged to:: Private residence Living Arrangements: Spouse/significant other;Children Available Help at Discharge: Family;Available 24 hours/day Type of Home: House       Home Layout: Two level;Laundry or work area in basement;Able to live on main level with bedroom/bathroom 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      Prior Function Level of Independence: Independent               Higher education careers adviser        Extremity/Trunk Assessment   Upper Extremity Assessment Upper Extremity Assessment: Overall WFL for tasks assessed    Lower Extremity Assessment Lower Extremity Assessment: Generalized weakness (TEndency to fatigue)       Communication   Communication: No difficulties;Other (comment) (Spanish; Offered to use video interpreter, but pt declined; able to communicate with pt's English skills an dthis PT's Spanish skills)  Cognition  Arousal/Alertness: Awake/alert Behavior During Therapy: WFL for tasks assessed/performed Overall Cognitive Status: Within Functional Limits for tasks assessed                                        General Comments General comments (skin integrity, edema, etc.): see gait details    Exercises     Assessment/Plan    PT Assessment Patient needs continued PT services  PT Problem List Decreased activity tolerance;Decreased knowledge of use of DME;Cardiopulmonary status limiting  activity       PT Treatment Interventions DME instruction;Gait training;Stair training;Functional mobility training;Therapeutic activities;Therapeutic exercise;Patient/family education    PT Goals (Current goals can be found in the Care Plan section)  Acute Rehab PT Goals Patient Stated Goal: to get home, and she hopes to not need supplemental oxygen PT Goal Formulation: With patient Time For Goal Achievement: 02/29/20 Potential to Achieve Goals: Good    Frequency Min 3X/week   Barriers to discharge        Co-evaluation               AM-PAC PT "6 Clicks" Mobility  Outcome Measure Help needed turning from your back to your side while in a flat bed without using bedrails?: None Help needed moving from lying on your back to sitting on the side of a flat bed without using bedrails?: None Help needed moving to and from a bed to a chair (including a wheelchair)?: None Help needed standing up from a chair using your arms (e.g., wheelchair or bedside chair)?: None Help needed to walk in hospital room?: None Help needed climbing 3-5 steps with a railing? : A Little 6 Click Score: 23    End of Session   Activity Tolerance: Patient tolerated treatment well;Other (comment) (though fatigued at end of walk) Patient left: in bed;with call bell/phone within reach Nurse Communication: Mobility status PT Visit Diagnosis: Other abnormalities of gait and mobility (R26.89)    Time: 6153-7943 PT Time Calculation (min) (ACUTE ONLY): 34 min   Charges:   PT Evaluation $PT Eval Low Complexity: 1 Low PT Treatments $Gait Training: 8-22 mins        Van Clines, PT  Acute Rehabilitation Services Pager [phone] Office [phone]   Levi Aland 02/15/2020, 7:17 PM

## 2020-02-15 NOTE — Progress Notes (Signed)
PROGRESS NOTE                                                                                                                                                                                                             Patient Demographics:    Amanda Blanchard, is a 65 y.o. female, DOB - 1955-12-24, XQJ:[id number]  Outpatient Primary MD for the patient is Patient, No Pcp Per   Admit date - 02/10/2020   LOS - 4  Chief Complaint  Patient presents with  . Cough  . Headache  . Chest Pain       Brief Narrative: Patient is a 65 y.o. female with PMHx of HTN, HLD-presented to the hospital with worsening shortness of breath/cough-ongoing for 2 weeks but worsening over the past few days-found to have acute hypoxic respiratory failure due to COVID-19 pneumonia.  COVID-19 vaccinated status: Unvaccinated  Significant Events: 1/25>> Admit to [facility] for hypoxia due to COVID-19 pneumonia  Significant studies: 1/25>>Chest x-ray:Diffuse bilateral infiltrates  COVID-19 medications: Steroids: 1/25>> Remdesivir: 1/25>>  Antibiotics: None  Microbiology data: 1/26 >>blood culture:no growth  Procedures: None  Consults: None  DVT prophylaxis: enoxaparin (LOVENOX) injection 40 mg Start: 02/11/20 0600    Subjective:   Patient in bed, appears comfortable, denies any headache, no fever, no chest pain or pressure, no shortness of breath , no abdominal pain. No focal weakness.   Assessment  & Plan :   Acute Hypoxic Resp Failure due to Covid 19 Viral pneumonia: Improved-was on 4 L on initial presentation-decrease down to 2-2 L today.  Stable continues to complain of shortness of breath with ambulation.  CRP downtrending.  Continue steroid/Remdesivir.  In the unlikely event she worsens-she has consented to the use of Actemra.    O2 requirements:  SpO2: 93 % O2 Flow Rate (L/min): 1 L/min   Prone/Incentive Spirometry:  encouraged  incentive spirometry use 3-4/hour.     Recent Labs  Lab 02/10/20 1224 02/10/20 1225 02/11/20 0040 02/11/20 0105 02/11/20 0318 02/11/20 0800 02/12/20 0453 02/13/20 0223 02/14/20 0459 02/14/20 0728 02/15/20 0337  WBC  --    < >  --   --  5.0  --  8.1 11.1* 9.5  --  10.4  HGB  --    < >  --   --  12.0  --  11.7* 11.4*  11.4*  --  11.4*  HCT  --    < >  --   --  37.8  --  34.4* 34.1* 33.4*  --  34.9*  PLT  --    < >  --   --  214  --  245 286 290  --  284  CRP  --    < >  --  17.8*  --  14.6* 11.0* 6.1* 3.6*  --  2.2*  BNP  --   --   --   --   --   --   --   --   --  298.0* 242.9*  DDIMER  --    < >  --  1.87*  --  1.65* 1.87* 1.85* 1.44*  --  1.24*  PROCALCITON  --   --   --  0.15  --   --   --   --   --   --   --   AST  --   --   --   --   --  79* 62* 55* 44*  --  33  ALT  --   --   --   --   --  34 31 31 28   --  24  ALKPHOS  --   --   --   --   --  126 107 100 111  --  104  BILITOT  --   --   --   --   --  1.0 1.4* 0.7 0.8  --  0.7  ALBUMIN  --   --   --   --   --  2.6* 2.4* 2.4* 2.4*  --  2.2*  LATICACIDVEN  --   --  1.7  --   --   --   --   --   --   --   --   SARSCOV2NAA POSITIVE*  --   --   --   --   --   --   --   --   --   --    < > = values in this interval not displayed.     AKI: Mild-likely hemodynamically mediated-in the setting of COVID-19 infection, lisinopril/HCTZ use-creatinine seems to have plateaued- FeNA < 1, IVF.  However her baseline creatinine not known no previous creatinine available in the system could have CKD 3 a baseline. Non acute UA and Renal .  HTN: BP stable without the use of antihypertensives-follow and resume when able.  HLD: Continue statin  Hypothyroidism: Apparently takes levothyroxine-but does not know the dose.  Obesity: BMI 30, follow with PCP.     Condition - Stable  Family Communication: Son-Jesus Alejandro-[phone] on 02/13/20  Code Status :  Full Code  Diet :  Diet Order            Diet Heart Room service  appropriate? Yes; Fluid consistency: Thin  Diet effective now                  Disposition Plan  :  Status is: Inpatient  Remains inpatient appropriate because:Inpatient level of care appropriate due to severity of illness   Dispo: The patient is from: Home              Anticipated d/c is to: Home              Anticipated d/c date is: 2 days  Patient currently is not medically stable to d/c.   Difficult to place patient No    Barriers to discharge: Hypoxia requiring O2 supplementation/complete 5 days of IV Remdesivir  Antimicorbials  :    Anti-infectives (From admission, onward)   Start     Dose/Rate Route Frequency Ordered Stop   02/12/20 1000  remdesivir 100 mg in sodium chloride 0.9 % 100 mL IVPB       "Followed by" Linked Group Details   100 mg 200 mL/hr over 30 Minutes Intravenous Daily 02/11/20 0301 02/16/20 0959   02/11/20 0300  remdesivir 200 mg in sodium chloride 0.9% 250 mL IVPB       "Followed by" Linked Group Details   200 mg 580 mL/hr over 30 Minutes Intravenous Once 02/11/20 0301 02/11/20 [id number]      Inpatient Medications  Scheduled Meds: . vitamin C  500 mg Oral Daily  . atorvastatin  20 mg Oral Daily  . benzonatate  200 mg Oral TID  . enoxaparin (LOVENOX) injection  40 mg Subcutaneous Q24H  . methylPREDNISolone (SOLU-MEDROL) injection  40 mg Intravenous BID  . zinc sulfate  220 mg Oral Daily   Continuous Infusions: . lactated ringers    . remdesivir 100 mg in NS 100 mL 100 mg (02/14/20 0825)   PRN Meds:.acetaminophen **OR** [DISCONTINUED] acetaminophen, albuterol, chlorpheniramine-HYDROcodone, guaiFENesin-dextromethorphan, hydrALAZINE   Time Spent in minutes  25  See all Orders from today for further details   Susa RaringPrashant Nashonda Limberg M.D on 02/15/2020 at 9:25 AM  To page go to [email or web address] - use universal password  [facility] -  Office  [phone]    Objective:   Vitals:   02/14/20 2035 02/15/20 0352 02/15/20 0400 02/15/20  0730  BP: 129/62 135/67    Pulse: (!) 59 60    Resp: 18 20    Temp: 98.4 F (36.9 C) (!) 97.5 F (36.4 C)    TempSrc: Oral Oral    SpO2: 95% 95% 98% 93%  Weight:      Height:        Wt Readings from Last 3 Encounters:  02/11/20 77.1 kg     Intake/Output Summary (Last 24 hours) at 02/15/2020 0925 Last data filed at 02/14/2020 2038 Gross per 24 hour  Intake 200 ml  Output 250 ml  Net -50 ml     Physical Exam  Awake Alert, No new F.N deficits, Normal affect [place].AT,PERRAL Supple Neck,No JVD, No cervical lymphadenopathy appriciated.  Symmetrical Chest wall movement, Good air movement bilaterally, CTAB RRR,No Gallops, Rubs or new Murmurs, No Parasternal Heave +ve B.Sounds, Abd Soft, No tenderness, No organomegaly appriciated, No rebound - guarding or rigidity. No Cyanosis, Clubbing or edema, No new Rash or bruise    Data Review:    CBC Recent Labs  Lab 02/11/20 0318 02/12/20 0453 02/13/20 0223 02/14/20 0459 02/15/20 0337  WBC 5.0 8.1 11.1* 9.5 10.4  HGB 12.0 11.7* 11.4* 11.4* 11.4*  HCT 37.8 34.4* 34.1* 33.4* 34.9*  PLT 214 245 286 290 284  MCV 96.9 95.3 96.1 92.8 94.8  MCH 30.8 32.4 32.1 31.7 31.0  MCHC 31.7 34.0 33.4 34.1 32.7  RDW 14.9 14.6 14.8 14.6 14.5  LYMPHSABS  --  1.0 1.1 0.9 0.8  MONOABS  --  0.4 0.2 0.3 0.5  EOSABS  --  0.0 0.0 0.0 0.0  BASOSABS  --  0.0 0.0 0.0 0.0    Chemistries  Recent Labs  Lab 02/11/20 0800 02/12/20 0453 02/13/20 0223 02/14/20 0459 02/14/20 0728 02/15/20  0337  NA 139 139 140 140  --  139  K 3.9 4.3 4.4 4.0  --  4.3  CL 109 111 114* 113*  --  112*  CO2 18* 19* 16* 18*  --  19*  GLUCOSE 315* 155* 164* 169*  --  192*  BUN 34* 41* 52* 48*  --  48*  CREATININE 1.58* 1.57* 1.53* 1.49*  --  1.46*  CALCIUM 8.1* 8.7* 8.1* 8.2*  --  8.1*  MG  --   --   --   --  2.6* 2.3  AST 79* 62* 55* 44*  --  33  ALT 34 31 31 28   --  24  ALKPHOS 126 107 100 111  --  104  BILITOT 1.0 1.4* 0.7 0.8  --  0.7    ------------------------------------------------------------------------------------------------------------------ No results for input(s): CHOL, HDL, LDLCALC, TRIG, CHOLHDL, LDLDIRECT in the last 72 hours.  No results found for: HGBA1C ------------------------------------------------------------------------------------------------------------------ No results for input(s): TSH, T4TOTAL, T3FREE, THYROIDAB in the last 72 hours.  Invalid input(s): FREET3 ------------------------------------------------------------------------------------------------------------------ No results for input(s): VITAMINB12, FOLATE, FERRITIN, TIBC, IRON, RETICCTPCT in the last 72 hours.  Coagulation profile No results for input(s): INR, PROTIME in the last 168 hours.  Recent Labs    02/14/20 0459 02/15/20 0337  DDIMER 1.44* 1.24*    Cardiac Enzymes No results for input(s): CKMB, TROPONINI, MYOGLOBIN in the last 168 hours.  Invalid input(s): CK ------------------------------------------------------------------------------------------------------------------    Component Value Date/Time   BNP 242.9 (H) 02/15/2020 02/17/2020    Micro Results Recent Results (from the past 240 hour(s))  SARS CORONAVIRUS 2 (TAT 6-24 HRS) Nasopharyngeal Nasopharyngeal Swab     Status: Abnormal   Collection Time: 02/10/20 12:24 PM   Specimen: Nasopharyngeal Swab  Result Value Ref Range Status   SARS Coronavirus 2 POSITIVE (A) NEGATIVE Final    Comment: (NOTE) SARS-CoV-2 target nucleic acids are DETECTED.  The SARS-CoV-2 RNA is generally detectable in upper and lower respiratory specimens during the acute phase of infection. Positive results are indicative of the presence of SARS-CoV-2 RNA. Clinical correlation with patient history and other diagnostic information is  necessary to determine patient infection status. Positive results do not rule out bacterial infection or co-infection with other viruses.  The expected  result is Negative.  Fact Sheet for Patients: 02/12/20  Fact Sheet for Healthcare Providers: HairSlick.no  This test is not yet approved or cleared by the quierodirigir.com FDA and  has been authorized for detection and/or diagnosis of SARS-CoV-2 by FDA under an Emergency Use Authorization (EUA). This EUA will remain  in effect (meaning this test can be used) for the duration of the COVID-19 declaration under Section 564(b)(1) of the Act, 21 U. S.C. section 360bbb-3(b)(1), unless the authorization is terminated or revoked sooner.   Performed at [facility], [address], [place], [place]   Blood Culture (routine x 2)     Status: None (Preliminary result)   Collection Time: 02/11/20 12:40 AM   Specimen: BLOOD RIGHT HAND  Result Value Ref Range Status   Specimen Description BLOOD RIGHT HAND  Final   Special Requests   Final    BOTTLES DRAWN AEROBIC AND ANAEROBIC Blood Culture adequate volume   Culture   Final    NO GROWTH 4 DAYS Performed at [facility], [address], [place], [place]    Report Status PENDING  Incomplete  Blood Culture (routine x 2)     Status: None (Preliminary  result)   Collection Time: 02/11/20 12:45 AM   Specimen: BLOOD  Result Value Ref Range Status   Specimen Description BLOOD RIGHT ANTECUBITAL  Final   Special Requests   Final    BOTTLES DRAWN AEROBIC AND ANAEROBIC Blood Culture adequate volume   Culture   Final    NO GROWTH 4 DAYS Performed at [facility], [address] [place], [place] [address]    Report Status PENDING  Incomplete    Radiology Reports US RENAL  Result Date: 02/14/2020 CLINICAL DATA:  Acute kidney injury. EXAM: RENAL / URINARY TRACT ULTRASOUND COMPLETE COMPARISON:  None. FINDINGS: Right Kidney: Renal measurements: 8.1 x 4.3 x 4.6 cm = volume: 83 mL. Borderline increased parenchymal echogenicity. No mass or hydronephrosis  visualized. No visualized renal calculi. Left Kidney: Renal measurements: 7.4 x 3.7 x 3.6 cm = volume: 52 mL. Borderline increased parenchymal echogenicity. No mass or hydronephrosis visualized. No visualized renal calculi. Bladder: Nondistended and not well evaluated. Other: None. IMPRESSION: 1. No obstructive uropathy. 2. Mild bilateral renal atrophy and borderline increased renal parenchymal echogenicity most consistent with chronic medical renal disease. Electronically Signed   By: Narda Rutherford M.D.   On: 02/14/2020 15:01   DG Chest Portable 1 View  Result Date: 02/10/2020 CLINICAL DATA:  Cough.  Shortness of breath. EXAM: PORTABLE CHEST 1 VIEW COMPARISON:  No prior. FINDINGS: Heart size normal. Diffuse bilateral interstitial infiltrates most consistent with pneumonitis. No pleural effusion or pneumothorax. Degenerative change thoracic spine. IMPRESSION: Diffuse bilateral interstitial infiltrates most consistent with pneumonitis. Question patient's COVID status. Electronically Signed   By: Maisie Fus  Register   On: 02/10/2020 12:52

## 2020-02-16 ENCOUNTER — Other Ambulatory Visit (HOSPITAL_COMMUNITY): Payer: Self-pay | Admitting: Internal Medicine

## 2020-02-16 LAB — CULTURE, BLOOD (ROUTINE X 2)
Culture: NO GROWTH
Culture: NO GROWTH
Special Requests: ADEQUATE
Special Requests: ADEQUATE

## 2020-02-16 LAB — COMPREHENSIVE METABOLIC PANEL
ALT: 22 U/L (ref 0–44)
AST: 26 U/L (ref 15–41)
Albumin: 2 g/dL — ABNORMAL LOW (ref 3.5–5.0)
Alkaline Phosphatase: 92 U/L (ref 38–126)
Anion gap: 8 (ref 5–15)
BUN: 45 mg/dL — ABNORMAL HIGH (ref 8–23)
CO2: 19 mmol/L — ABNORMAL LOW (ref 22–32)
Calcium: 8.3 mg/dL — ABNORMAL LOW (ref 8.9–10.3)
Chloride: 111 mmol/L (ref 98–111)
Creatinine, Ser: 1.35 mg/dL — ABNORMAL HIGH (ref 0.44–1.00)
GFR, Estimated: 44 mL/min — ABNORMAL LOW (ref >60)
Glucose, Bld: 188 mg/dL — ABNORMAL HIGH (ref 70–99)
Potassium: 4.3 mmol/L (ref 3.5–5.1)
Sodium: 138 mmol/L (ref 135–145)
Total Bilirubin: 1 mg/dL (ref 0.3–1.2)
Total Protein: 5.2 g/dL — ABNORMAL LOW (ref 6.5–8.1)

## 2020-02-16 LAB — CBC WITH DIFFERENTIAL/PLATELET
Abs Immature Granulocytes: 0.16 10*3/uL — ABNORMAL HIGH (ref 0.00–0.07)
Basophils Absolute: 0 10*3/uL (ref 0.0–0.1)
Basophils Relative: 0 %
Eosinophils Absolute: 0 10*3/uL (ref 0.0–0.5)
Eosinophils Relative: 0 %
HCT: 33.9 % — ABNORMAL LOW (ref 36.0–46.0)
Hemoglobin: 11.3 g/dL — ABNORMAL LOW (ref 12.0–15.0)
Immature Granulocytes: 1 %
Lymphocytes Relative: 7 %
Lymphs Abs: 0.8 10*3/uL (ref 0.7–4.0)
MCH: 31.4 pg (ref 26.0–34.0)
MCHC: 33.3 g/dL (ref 30.0–36.0)
MCV: 94.2 fL (ref 80.0–100.0)
Monocytes Absolute: 0.9 10*3/uL (ref 0.1–1.0)
Monocytes Relative: 7 %
Neutro Abs: 10.1 10*3/uL — ABNORMAL HIGH (ref 1.7–7.7)
Neutrophils Relative %: 85 %
Platelets: 247 10*3/uL (ref 150–400)
RBC: 3.6 MIL/uL — ABNORMAL LOW (ref 3.87–5.11)
RDW: 14.4 % (ref 11.5–15.5)
WBC: 11.9 10*3/uL — ABNORMAL HIGH (ref 4.0–10.5)
nRBC: 0 % (ref 0.0–0.2)

## 2020-02-16 LAB — D-DIMER, QUANTITATIVE: D-Dimer, Quant: 1.18 ug/mL-FEU — ABNORMAL HIGH (ref 0.00–0.50)

## 2020-02-16 LAB — MAGNESIUM: Magnesium: 2.1 mg/dL (ref 1.7–2.4)

## 2020-02-16 LAB — BRAIN NATRIURETIC PEPTIDE: B Natriuretic Peptide: 197.1 pg/mL — ABNORMAL HIGH (ref 0.0–100.0)

## 2020-02-16 LAB — C-REACTIVE PROTEIN: CRP: 1.3 mg/dL — ABNORMAL HIGH (ref <1.0)

## 2020-02-16 MED ORDER — ATORVASTATIN CALCIUM 20 MG PO TABS: 20.0000 mg | ORAL_TABLET | Freq: Every day | ORAL | 0 refills | Status: DC

## 2020-02-16 MED ORDER — LEVOTHYROXINE SODIUM 100 MCG PO TABS: 100.0000 ug | ORAL_TABLET | Freq: Every day | ORAL | 0 refills | Status: DC

## 2020-02-16 MED ORDER — LACTATED RINGERS IV SOLN: INTRAVENOUS | Status: AC

## 2020-02-16 MED ORDER — ALBUTEROL SULFATE HFA 108 (90 BASE) MCG/ACT IN AERS
2.0000 | INHALATION_SPRAY | Freq: Four times a day (QID) | RESPIRATORY_TRACT | 0 refills | Status: DC | PRN
Start: 2020-02-16 — End: 2020-02-16

## 2020-02-16 MED ORDER — AMLODIPINE BESYLATE 10 MG PO TABS: 10.0000 mg | ORAL_TABLET | Freq: Every day | ORAL | 11 refills | Status: DC

## 2020-02-16 MED FILL — ATORVASTATIN CALCIUM 20 MG: 20 | 30 days supply | Qty: 30 | Fill #0

## 2020-02-16 MED FILL — AMLODIPINE BESYLATE 10 MG T: 10 | 30 days supply | Qty: 30 | Fill #0

## 2020-02-16 MED FILL — ALBUTEROL SULFATE HFA 108 (: 108 (90 BAS | 25 days supply | Qty: 18 | Fill #0

## 2020-02-16 MED FILL — LEVOTHYROXINE SODIUM 100 MC: 100 | 30 days supply | Qty: 30 | Fill #0

## 2020-02-16 NOTE — Progress Notes (Signed)
PT Progress Note  SATURATION QUALIFICATIONS: (This note is used to comply with regulatory documentation for home oxygen)  Patient Saturations on Room Air at Rest = 94%  Patient Saturations on Room Air while Ambulating = 85%  Patient Saturations on 2 Liters of oxygen while Ambulating = 90%  Please briefly explain why patient needs home oxygen: 2L O2 required to maintain SpO2 at 90% during activity.  Aida Raider, PT  Office # [phone] Pager [phone]

## 2020-02-16 NOTE — TOC Transition Note (Signed)
Transition of Care [facility]) - CM/SW Discharge Note   Patient Details  Name: Amanda Blanchard MRN: [id number] Date of Birth: [birth year]  [facility] CM/SW Contact:  Durenda Guthrie, RN Phone Number: 02/16/2020, 10:20 AM    Clinical Narrative:   Case manager notified that patient will need oxygen for home. Patient is uninsured. CM contacted Velna Hatchet with [employer], they will arrange for oxygen delivery. CM also will contact patient's husband to inform them to wait for concentrator to be delivered to the home. Bedside RN to be notified as well.     Final next level of care: Home/Self Care Barriers to Discharge: No Barriers Identified   Patient Goals and CMS Choice     Choice offered to / list presented to : NA  Discharge Placement                       Discharge Plan and Services   Discharge Planning Services: CM Consult Post Acute Care Choice: Durable Medical Equipment          DME Arranged: Oxygen DME Agency: [employer] Date DME Agency Contacted: 02/16/20 Time DME Agency Contacted: [phone]   HH Arranged: NA HH Agency: NA        Social Determinants of Health (SDOH) Interventions     Readmission Risk Interventions No flowsheet data found.

## 2020-02-16 NOTE — Discharge Instructions (Signed)
Follow with Primary MD in 7 days   Get CBC, CMP, TSH, 2 view Chest X ray -  checked next visit within 1 week by Primary MD    Activity: As tolerated with Full fall precautions use walker/cane & assistance as needed  Disposition Home    Diet: Heart Healthy    Special Instructions: If you have smoked or chewed Tobacco  in the last 2 yrs please stop smoking, stop any regular Alcohol  and or any Recreational drug use.  On your next visit with your primary care physician please Get Medicines reviewed and adjusted.  Please request your Prim.MD to go over all Hospital Tests and Procedure/Radiological results at the follow up, please get all Hospital records sent to your Prim MD by signing hospital release before you go home.  If you experience worsening of your admission symptoms, develop shortness of breath, life threatening emergency, suicidal or homicidal thoughts you must seek medical attention immediately by calling 911 or calling your MD immediately  if symptoms less severe.  You Must read complete instructions/literature along with all the possible adverse reactions/side effects for all the Medicines you take and that have been prescribed to you. Take any new Medicines after you have completely understood and accpet all the possible adverse reactions/side effects.          Person Under Monitoring Name: Amanda Blanchard  Location: [address] [place] [address]   Infection Prevention Recommendations for Individuals Confirmed to have, or Being Evaluated for, 2019 Novel Coronavirus (COVID-19) Infection Who Receive Care at Home  Individuals who are confirmed to have, or are being evaluated for, COVID-19 should follow the prevention steps below until a healthcare provider or local or state health department says they can return to normal activities.  Stay home except to get medical care You should restrict activities outside your home, except for getting medical care. Do not  go to work, school, or public areas, and do not use public transportation or taxis.  Call ahead before visiting your doctor Before your medical appointment, call the healthcare provider and tell them that you have, or are being evaluated for, COVID-19 infection. This will help the healthcare providers office take steps to keep other people from getting infected. Ask your healthcare provider to call the local or state health department.  Monitor your symptoms Seek prompt medical attention if your illness is worsening (e.g., difficulty breathing). Before going to your medical appointment, call the healthcare provider and tell them that you have, or are being evaluated for, COVID-19 infection. Ask your healthcare provider to call the local or state health department.  Wear a facemask You should wear a facemask that covers your nose and mouth when you are in the same room with other people and when you visit a healthcare provider. People who live with or visit you should also wear a facemask while they are in the same room with you.  Separate yourself from other people in your home As much as possible, you should stay in a different room from other people in your home. Also, you should use a separate bathroom, if available.  Avoid sharing household items You should not share dishes, drinking glasses, cups, eating utensils, towels, bedding, or other items with other people in your home. After using these items, you should wash them thoroughly with soap and water.  Cover your coughs and sneezes Cover your mouth and nose with a tissue when you cough or sneeze, or you can cough  or sneeze into your sleeve. Throw used tissues in a lined trash can, and immediately wash your hands with soap and water for at least 20 seconds or use an alcohol-based hand rub.  Wash your [facility] your hands often and thoroughly with soap and water for at least 20 seconds. You can use an alcohol-based  hand sanitizer if soap and water are not available and if your hands are not visibly dirty. Avoid touching your eyes, nose, and mouth with unwashed hands.   Prevention Steps for Caregivers and Household Members of Individuals Confirmed to have, or Being Evaluated for, COVID-19 Infection Being Cared for in the Home  If you live with, or provide care at home for, a person confirmed to have, or being evaluated for, COVID-19 infection please follow these guidelines to prevent infection:  Follow healthcare providers instructions Make sure that you understand and can help the patient follow any healthcare provider instructions for all care.  Provide for the patients basic needs You should help the patient with basic needs in the home and provide support for getting groceries, prescriptions, and other personal needs.  Monitor the patients symptoms If they are getting sicker, call his or her medical provider and tell them that the patient has, or is being evaluated for, COVID-19 infection. This will help the healthcare providers office take steps to keep other people from getting infected. Ask the healthcare provider to call the local or state health department.  Limit the number of people who have contact with the patient  If possible, have only one caregiver for the patient.  Other household members should stay in another home or place of residence. If this is not possible, they should stay  in another room, or be separated from the patient as much as possible. Use a separate bathroom, if available.  Restrict visitors who do not have an essential need to be in the home.  Keep older adults, very young children, and other sick people away from the patient Keep older adults, very young children, and those who have compromised immune systems or chronic health conditions away from the patient. This includes people with chronic heart, lung, or kidney conditions, diabetes, and  cancer.  Ensure good ventilation Make sure that shared spaces in the home have good air flow, such as from an air conditioner or an opened window, weather permitting.  Wash your hands often  Wash your hands often and thoroughly with soap and water for at least 20 seconds. You can use an alcohol based hand sanitizer if soap and water are not available and if your hands are not visibly dirty.  Avoid touching your eyes, nose, and mouth with unwashed hands.  Use disposable paper towels to dry your hands. If not available, use dedicated cloth towels and replace them when they become wet.  Wear a facemask and gloves  Wear a disposable facemask at all times in the room and gloves when you touch or have contact with the patients blood, body fluids, and/or secretions or excretions, such as sweat, saliva, sputum, nasal mucus, vomit, urine, or feces.  Ensure the mask fits over your nose and mouth tightly, and do not touch it during use.  Throw out disposable facemasks and gloves after using them. Do not reuse.  Wash your hands immediately after removing your facemask and gloves.  If your personal clothing becomes contaminated, carefully remove clothing and launder. Wash your hands after handling contaminated clothing.  Place all used disposable facemasks, gloves, and other  waste in a lined container before disposing them with other household waste.  Remove gloves and wash your hands immediately after handling these items.  Do not share dishes, glasses, or other household items with the patient  Avoid sharing household items. You should not share dishes, drinking glasses, cups, eating utensils, towels, bedding, or other items with a patient who is confirmed to have, or being evaluated for, COVID-19 infection.  After the person uses these items, you should wash them thoroughly with soap and water.  Wash laundry thoroughly  Immediately remove and wash clothes or bedding that have blood, body  fluids, and/or secretions or excretions, such as sweat, saliva, sputum, nasal mucus, vomit, urine, or feces, on them.  Wear gloves when handling laundry from the patient.  Read and follow directions on labels of laundry or clothing items and detergent. In general, wash and dry with the warmest temperatures recommended on the label.  Clean all areas the individual has used often  Clean all touchable surfaces, such as counters, tabletops, doorknobs, bathroom fixtures, toilets, phones, keyboards, tablets, and bedside tables, every day. Also, clean any surfaces that may have blood, body fluids, and/or secretions or excretions on them.  Wear gloves when cleaning surfaces the patient has come in contact with.  Use a diluted bleach solution (e.g., dilute bleach with 1 part bleach and 10 parts water) or a household disinfectant with a label that says EPA-registered for coronaviruses. To make a bleach solution at home, add 1 tablespoon of bleach to 1 quart (4 cups) of water. For a larger supply, add  cup of bleach to 1 gallon (16 cups) of water.  Read labels of cleaning products and follow recommendations provided on product labels. Labels contain instructions for safe and effective use of the cleaning product including precautions you should take when applying the product, such as wearing gloves or eye protection and making sure you have good ventilation during use of the product.  Remove gloves and wash hands immediately after cleaning.  Monitor yourself for signs and symptoms of illness Caregivers and household members are considered close contacts, should monitor their health, and will be asked to limit movement outside of the home to the extent possible. Follow the monitoring steps for close contacts listed on the symptom monitoring form.   ? If you have additional questions, contact your local health department or call the epidemiologist on call at [phone] (available 24/7). ? This  guidance is subject to change. For the most up-to-date guidance from [facility], please refer to their website: [email or web address]

## 2020-02-16 NOTE — Discharge Summary (Signed)
Amanda Blanchard GBT:[id number] DOB: 1955-11-07 DOA: 02/10/2020  PCP: Patient, No Pcp Per  Admit date: 02/10/2020  Discharge date: 02/16/2020  Admitted From: Home  Disposition:  Home   Recommendations for Outpatient Follow-up:   Follow up with PCP in 1-2 weeks  PCP Please obtain BMP/CBC, 2 view CXR in 1week,  (see Discharge instructions)   PCP Please follow up on the following pending results: Check TSH, CBC, CMP, 2 view chest x-ray in 7 to 10 days.   Home Health: None Equipment/Devices: 2Lit o2  Consultations: None  Discharge Condition: Stable    CODE STATUS: Full    Diet Recommendation: Heart Healthy   Diet Order            Diet - low sodium heart healthy           Diet Heart Room service appropriate? Yes; Fluid consistency: Thin  Diet effective now                  Chief Complaint  Patient presents with  . Cough  . Headache  . Chest Pain     Brief history of present illness from the day of admission and additional interim summary    Patient is a 65 y.o. female with PMHx of HTN, HLD-presented to the hospital with worsening shortness of breath/cough-ongoing for 2 weeks but worsening over the past few days-found to have acute hypoxic respiratory failure due to COVID-19 pneumonia.  COVID-19 vaccinated status: Unvaccinated  Significant Events: 1/25>> Admit to [facility] for hypoxia due to COVID-19 pneumonia  Significant studies: 1/25>>Chest x-ray:Diffuse bilateral infiltrates  COVID-19 medications: Steroids: 1/25>> Remdesivir: 1/25>>                                                                 Hospital Course   Acute Hypoxic Resp Failure due to Covid 19 Viral pneumonia: she is unvaccinated and unfortunately sustained moderate parenchymal lung injury, treated with full course of  steroids and Remdesivir, initially on 4 L nasal cannula oxygen now on room air at rest.  Upon ambulation on room air she does drop to 87%, pulse ox improves back to above 90% on application of 2 L nasal cannula oxygen. She is also symptomatic upon ambulation and does get short of breath. At this point she will be discharged on 2 L nasal cannula oxygen along with a rescue inhaler, PCP follow-up in 7 to 10 days post discharge.  SpO2: 90 % O2 Flow Rate (L/min): 1 L/min  Recent Labs  Lab 02/10/20 1224 02/10/20 1225 02/11/20 0040 02/11/20 0105 02/11/20 0318 02/12/20 0453 02/13/20 0223 02/14/20 0459 02/14/20 0728 02/15/20 0337 02/16/20 0341  WBC  --    < >  --   --    < > 8.1 11.1* 9.5  --  10.4 11.9*  CRP  --   --   --  17.8*   < > 11.0* 6.1* 3.6*  --  2.2* 1.3*  DDIMER  --   --   --  1.87*   < > 1.87* 1.85* 1.44*  --  1.24* 1.18*  BNP  --   --   --   --   --   --   --   --  298.0* 242.9* 197.1*  PROCALCITON  --   --   --  0.15  --   --   --   --   --   --   --   LATICACIDVEN  --   --  1.7  --   --   --   --   --   --   --   --   AST  --   --   --   --    < > 62* 55* 44*  --  33 26  ALT  --   --   --   --    < > 31 31 28   --  24 22  ALKPHOS  --   --   --   --    < > 107 100 111  --  104 92  BILITOT  --   --   --   --    < > 1.4* 0.7 0.8  --  0.7 1.0  ALBUMIN  --   --   --   --    < > 2.4* 2.4* 2.4*  --  2.2* 2.0*  SARSCOV2NAA POSITIVE*  --   --   --   --   --   --   --   --   --   --    < > = values in this interval not displayed.      AKI: Question some superimposed CKD 3A. No previous creatinine on file, with hydration creatinine has improved, will discontinue ACE inhibitor and HCTZ combination which she was on at home along with NSAIDs. Renal ultrasound and UA nonacute. Creatinine has trended down and now stabilized around 1.4. Request PCP to monitor renal function closely outpatient. Initial work-up suggests dehydration with FeNA under 1.  HTN: BP stable switched her to  Norvasc.  HLD: Continue statin  Hypothyroidism:  Her home dose Synthroid confirmed with [facility] and 1 month prescription given upon discharge  Obesity: BMI 30, follow with PCP.   Discharge diagnosis     Principal Problem:   Acute respiratory failure due to COVID-19 [facility]) Active Problems:   Essential hypertension   Hypothyroidism   HLD (hyperlipidemia)    Discharge instructions    Discharge Instructions    Diet - low sodium heart healthy   Complete by: As directed    Discharge instructions   Complete by: As directed    Follow with Primary MD in 7 days   Get CBC, CMP, TSH, 2 view Chest X ray -  checked next visit within 1 week by Primary MD    Activity: As tolerated with Full fall precautions use walker/cane & assistance as needed  Disposition Home    Diet: Heart Healthy    Special Instructions: If you have smoked or chewed Tobacco  in the last 2 yrs please stop smoking, stop any regular Alcohol  and or any Recreational drug use.  On your next visit with your primary care physician please Get Medicines reviewed and adjusted.  Please request your  Prim.MD to go over all Hospital Tests and Procedure/Radiological results at the follow up, please get all Hospital records sent to your Prim MD by signing hospital release before you go home.  If you experience worsening of your admission symptoms, develop shortness of breath, life threatening emergency, suicidal or homicidal thoughts you must seek medical attention immediately by calling 911 or calling your MD immediately  if symptoms less severe.  You Must read complete instructions/literature along with all the possible adverse reactions/side effects for all the Medicines you take and that have been prescribed to you. Take any new Medicines after you have completely understood and accpet all the possible adverse reactions/side effects.   Increase activity slowly   Complete by: As directed        Discharge Medications   Allergies as of 02/16/2020   Not on File     Medication List    STOP taking these medications   diclofenac 75 MG EC tablet Commonly known as: VOLTAREN   fluconazole 150 MG tablet Commonly known as: DIFLUCAN   lisinopril-hydrochlorothiazide 10-12.5 MG tablet Commonly known as: ZESTORETIC     TAKE these medications   albuterol 108 (90 Base) MCG/ACT inhaler Commonly known as: VENTOLIN HFA Inhale 2 puffs into the lungs every 6 (six) hours as needed for wheezing or shortness of breath.   amLODipine 10 MG tablet Commonly known as: NORVASC Take 1 tablet (10 mg total) by mouth daily.   atorvastatin 20 MG tablet Commonly known as: LIPITOR Take 1 tablet (20 mg total) by mouth daily.   levothyroxine 100 MCG tablet Commonly known as: Synthroid Take 1 tablet (100 mcg total) by mouth daily. What changed:   medication strength  how much to take            Durable Medical Equipment  (From admission, onward)         Start     Ordered   02/16/20 1002  For home use only DME oxygen  Once       Question Answer Comment  Length of Need 6 Months   Mode or (Route) Nasal cannula   Liters per Minute 2   Frequency Continuous (stationary and portable oxygen unit needed)   Oxygen conserving device Yes   Oxygen delivery system Gas      02/16/20 1001           Follow-up Information    [facility]. Schedule an appointment as soon as possible for a visit in 1 week(s).   Contact information: [address] [place] [address] [phone]              Major procedures and Radiology Reports - PLEASE review detailed and final reports thoroughly  -       US RENAL  Result Date: 02/14/2020 CLINICAL DATA:  Acute kidney injury. EXAM: RENAL / URINARY TRACT ULTRASOUND COMPLETE COMPARISON:  None. FINDINGS: Right Kidney: Renal measurements: 8.1 x 4.3 x 4.6 cm = volume: 83 mL. Borderline increased  parenchymal echogenicity. No mass or hydronephrosis visualized. No visualized renal calculi. Left Kidney: Renal measurements: 7.4 x 3.7 x 3.6 cm = volume: 52 mL. Borderline increased parenchymal echogenicity. No mass or hydronephrosis visualized. No visualized renal calculi. Bladder: Nondistended and not well evaluated. Other: None. IMPRESSION: 1. No obstructive uropathy. 2. Mild bilateral renal atrophy and borderline increased renal parenchymal echogenicity most consistent with chronic medical renal disease. Electronically Signed   By: Narda Rutherford M.D.   On:  02/14/2020 15:01   DG Chest Port 1 View  Result Date: 02/15/2020 CLINICAL DATA:  Shortness of breath, COVID-19 positive, infiltrates EXAM: PORTABLE CHEST 1 VIEW COMPARISON:  Portable exam 0714 hours compared to 02/10/2020 FINDINGS: Normal heart size, mediastinal contours, and pulmonary vascularity. Patchy infiltrates in the mid to lower lungs consistent with multifocal pneumonia, improved. No pleural effusion or pneumothorax. Bones demineralized. IMPRESSION: Improved pulmonary infiltrates. Electronically Signed   By: Ulyses SouthwardMark  Boles M.D.   On: 02/15/2020 09:23   DG Chest Portable 1 View  Result Date: 02/10/2020 CLINICAL DATA:  Cough.  Shortness of breath. EXAM: PORTABLE CHEST 1 VIEW COMPARISON:  No prior. FINDINGS: Heart size normal. Diffuse bilateral interstitial infiltrates most consistent with pneumonitis. No pleural effusion or pneumothorax. Degenerative change thoracic spine. IMPRESSION: Diffuse bilateral interstitial infiltrates most consistent with pneumonitis. Question patient's COVID status. Electronically Signed   By: Maisie Fushomas  Register   On: 02/10/2020 12:52    Micro Results     Recent Results (from the past 240 hour(s))  SARS CORONAVIRUS 2 (TAT 6-24 HRS) Nasopharyngeal Nasopharyngeal Swab     Status: Abnormal   Collection Time: 02/10/20 12:24 PM   Specimen: Nasopharyngeal Swab  Result Value Ref Range Status   SARS Coronavirus 2  POSITIVE (A) NEGATIVE Final    Comment: (NOTE) SARS-CoV-2 target nucleic acids are DETECTED.  The SARS-CoV-2 RNA is generally detectable in upper and lower respiratory specimens during the acute phase of infection. Positive results are indicative of the presence of SARS-CoV-2 RNA. Clinical correlation with patient history and other diagnostic information is  necessary to determine patient infection status. Positive results do not rule out bacterial infection or co-infection with other viruses.  The expected result is Negative.  Fact Sheet for Patients: HairSlick.nohttps://www.fda.gov/media/138098/download  Fact Sheet for Healthcare Providers: quierodirigir.comhttps://www.fda.gov/media/138095/download  This test is not yet approved or cleared by the [place] FDA and  has been authorized for detection and/or diagnosis of SARS-CoV-2 by FDA under an Emergency Use Authorization (EUA). This EUA will remain  in effect (meaning this test can be used) for the duration of the COVID-19 declaration under Section 564(b)(1) of the Act, 21 U. S.C. section 360bbb-3(b)(1), unless the authorization is terminated or revoked sooner.   Performed at [facility], [address] [place], [place] [address]   Blood Culture (routine x 2)     Status: None (Preliminary result)   Collection Time: 02/11/20 12:40 AM   Specimen: BLOOD RIGHT HAND  Result Value Ref Range Status   Specimen Description BLOOD RIGHT HAND  Final   Special Requests   Final    BOTTLES DRAWN AEROBIC AND ANAEROBIC Blood Culture adequate volume   Culture   Final    NO GROWTH 4 DAYS Performed at [facility], [address] [place], [place] [address]    Report Status PENDING  Incomplete  Blood Culture (routine x 2)     Status: None (Preliminary result)   Collection Time: 02/11/20 12:45 AM   Specimen: BLOOD  Result Value Ref Range Status   Specimen Description BLOOD RIGHT ANTECUBITAL  Final   Special Requests   Final    BOTTLES DRAWN  AEROBIC AND ANAEROBIC Blood Culture adequate volume   Culture   Final    NO GROWTH 4 DAYS Performed at [facility], [address] [place], [place] [address]    Report Status PENDING  Incomplete    Today   Subjective    Amanda Blanchard today has no  headache,no chest abdominal pain,no new weakness tingling or numbness, feels much better wants to go home today.     Objective   Blood pressure 132/67, pulse 61, temperature 97.8 F (36.6 C), temperature source Oral, resp. rate 18, height 5\' 3"  (1.6 m), weight 77.1 kg, SpO2 90 %.  No intake or output data in the 24 hours ending 02/16/20 1009  Exam  Awake Alert, No new F.N deficits, Normal affect [place].AT,PERRAL Supple Neck,No JVD, No cervical lymphadenopathy appriciated.  Symmetrical Chest wall movement, Good air movement bilaterally, CTAB RRR,No Gallops,Rubs or new Murmurs, No Parasternal Heave +ve B.Sounds, Abd Soft, Non tender, No organomegaly appriciated, No rebound -guarding or rigidity. No Cyanosis, Clubbing or edema, No new Rash or bruise   Data Review   CBC w Diff:  Lab Results  Component Value Date   WBC 11.9 (H) 02/16/2020   HGB 11.3 (L) 02/16/2020   HCT 33.9 (L) 02/16/2020   PLT 247 02/16/2020   LYMPHOPCT 7 02/16/2020   MONOPCT 7 02/16/2020   EOSPCT 0 02/16/2020   BASOPCT 0 02/16/2020    CMP:  Lab Results  Component Value Date   NA 138 02/16/2020   K 4.3 02/16/2020   CL 111 02/16/2020   CO2 19 (L) 02/16/2020   BUN 45 (H) 02/16/2020   CREATININE 1.35 (H) 02/16/2020   PROT 5.2 (L) 02/16/2020   ALBUMIN 2.0 (L) 02/16/2020   BILITOT 1.0 02/16/2020   ALKPHOS 92 02/16/2020   AST 26 02/16/2020   ALT 22 02/16/2020  .   Total Time in preparing paper work, data evaluation and todays exam - 35 minutes  02/18/2020 M.D on 02/16/2020 at 10:09 AM  [facility]

## 2020-02-16 NOTE — Progress Notes (Signed)
Physical Therapy Treatment Patient Details Name: Amanda Blanchard MRN: [id number] DOB: 07-27-1955 Today's Date: 02/16/2020    History of Present Illness Patient is a 65 y.o. female with PMHx of HTN, HLD-presented to the hospital with worsening shortness of breath/cough-ongoing for 2 weeks but worsening over the past few days-found to have acute hypoxic respiratory failure due to COVID-19 pneumonia.    PT Comments    Pt demonstrates mod I bed mobility and transfers. Supervision ambulation 200' without AD on RA. Desat to 85%. 2/4 DOE. 2-minute seated recovery time to return to 90%. Supplemental O2 2L needed to maintain SpO2 > 90% during activity.    Follow Up Recommendations  No PT follow up     Equipment Recommendations  None recommended by PT    Recommendations for Other Services       Precautions / Restrictions Precautions Precautions: Other (comment) Precaution Comments: watch O2 sats    Mobility  Bed Mobility Overal bed mobility: Modified Independent                Transfers Overall transfer level: Modified independent Equipment used: None                Ambulation/Gait Ambulation/Gait assistance: Supervision Gait Distance (Feet): 200 Feet Assistive device: None Gait Pattern/deviations: WFL(Within Functional Limits) Gait velocity: decreased Gait velocity interpretation: 1.31 - 2.62 ft/sec, indicative of limited community ambulator General Gait Details: desat to 85% on RA, 2-minute recovery time seated for return to 90%   Stairs             Wheelchair Mobility    Modified Rankin (Stroke Patients Only)       Balance Overall balance assessment: No apparent balance deficits (not formally assessed)                                          Cognition Arousal/Alertness: Awake/alert Behavior During Therapy: WFL for tasks assessed/performed Overall Cognitive Status: Within Functional Limits for tasks assessed                                         Exercises      General Comments        Pertinent Vitals/Pain Pain Assessment: No/denies pain    Home Living                      Prior Function            PT Goals (current goals can now be found in the care plan section) Acute Rehab PT Goals Patient Stated Goal: home Progress towards PT goals: Progressing toward goals    Frequency    Min 3X/week      PT Plan Current plan remains appropriate    Co-evaluation              AM-PAC PT "6 Clicks" Mobility   Outcome Measure  Help needed turning from your back to your side while in a flat bed without using bedrails?: None Help needed moving from lying on your back to sitting on the side of a flat bed without using bedrails?: None Help needed moving to and from a bed to a chair (including a wheelchair)?: None Help needed standing up from a chair using your arms (e.g., wheelchair or bedside  chair)?: None Help needed to walk in hospital room?: A Little Help needed climbing 3-5 steps with a railing? : A Little 6 Click Score: 22    End of Session Equipment Utilized During Treatment: Oxygen Activity Tolerance: Patient tolerated treatment well Patient left: in bed;with call bell/phone within reach Nurse Communication: Mobility status PT Visit Diagnosis: Other abnormalities of gait and mobility (R26.89)     Time: 0272-5366 PT Time Calculation (min) (ACUTE ONLY): 9 min  Charges:  $Gait Training: 8-22 mins                     Amanda Blanchard, PT  Office # [phone] Pager [phone]    [name] 02/16/2020, 11:11 AM

## 2020-02-16 NOTE — Progress Notes (Signed)
Pt given discharge instructions, prescriptions, and care notes using spanish interpretation. Home O2 delivered to room, instructed how to use. Pt verbalized understanding AEB no further questions or concerns at this time. IV was discontinued, no redness, pain, or swelling noted at this time. Telemetry discontinued and Centralized Telemetry was notified. Pt left the floor via wheelchair with staff in stable condition.

## 2020-03-09 ENCOUNTER — Ambulatory Visit: Payer: Self-pay | Attending: Internal Medicine | Admitting: Internal Medicine

## 2020-03-09 ENCOUNTER — Other Ambulatory Visit: Payer: Self-pay

## 2020-03-09 DIAGNOSIS — J9601 Acute respiratory failure with hypoxia: Secondary | ICD-10-CM

## 2020-03-09 DIAGNOSIS — R600 Localized edema: Secondary | ICD-10-CM

## 2020-03-09 DIAGNOSIS — E785 Hyperlipidemia, unspecified: Secondary | ICD-10-CM

## 2020-03-09 DIAGNOSIS — E039 Hypothyroidism, unspecified: Secondary | ICD-10-CM

## 2020-03-09 DIAGNOSIS — J1282 Pneumonia due to coronavirus disease 2019: Secondary | ICD-10-CM

## 2020-03-09 DIAGNOSIS — N1832 Chronic kidney disease, stage 3b: Secondary | ICD-10-CM | POA: Insufficient documentation

## 2020-03-09 DIAGNOSIS — U071 COVID-19: Secondary | ICD-10-CM

## 2020-03-09 DIAGNOSIS — I10 Essential (primary) hypertension: Secondary | ICD-10-CM

## 2020-03-09 DIAGNOSIS — Z09 Encounter for follow-up examination after completed treatment for conditions other than malignant neoplasm: Secondary | ICD-10-CM

## 2020-03-09 MED ORDER — LEVOTHYROXINE SODIUM 100 MCG PO TABS: 100.0000 ug | ORAL_TABLET | Freq: Every day | ORAL | 3 refills | Status: DC

## 2020-03-09 MED ORDER — ATORVASTATIN CALCIUM 20 MG PO TABS: 20.0000 mg | ORAL_TABLET | Freq: Every day | ORAL | 3 refills | Status: DC

## 2020-03-09 NOTE — Progress Notes (Signed)
Virtual Visit via Telephone Note  I connected with Amanda Blanchard on 03/09/20 at  8:30 AM EST by telephone and verified that I am speaking with the correct person using two identifiers.  Location: Patient: home Provider: office  The patient, my CMA Ms. Donata Duff, myself and Dala Dock from [employer] [phone]) participated in this encounter. I discussed the limitations, risks, security and privacy concerns of performing an evaluation and management service by telephone and the availability of in person appointments. I also discussed with the patient that there may be a patient responsible charge related to this service. The patient expressed understanding and agreed to proceed.   History of Present Illness: Pt with hx of HTN, Hypothyroid, HL, CKD 3.  This is new patient visit for hospital follow-up.  [facility] [place].  She does not recall the name.  Last saw him last mth before hosp.  Patient hospitalized 1/25-30 01/2020 with acute hypoxic respiratory failure secondary to COVID-19 pneumonia.  She was unvaccinated.  Treated with steroids and remdesivir and initially required 4 L oxygen via nasal cannula.  By the time of discharge she was still requiring oxygen at 2 L.  She was discharged on 2 L of O2.  Found to have some renal insufficiency with GFR in the 30s and creatinine of 1.62.  She was on ACE inhibitor, HCTZ and NSAID.  These were held.  By the time of discharge creatinine was 1.35 with GFR of 44.  She was discharged home on amlodipine 10 mg instead.  Told to follow-up with PCP in 1 to 2 weeks for repeat lab tests and chest x-ray.  Today:  Feeling better.  Using O2 intermittently during the day for 1-2 hrs. SOB and cough have resolved.  She does not have pulse oximetry at home. -compliant with taking Norvasc, Levothyroxine and Lipitor. Has device to check BP. Last checked 2 wks ago. Does not recall reading. She has noticed swelling in the feet  since yesterday   Outpatient Encounter Medications as of 03/09/2020  Medication Sig  . albuterol (VENTOLIN HFA) 108 (90 Base) MCG/ACT inhaler Inhale 2 puffs into the lungs every 6 (six) hours as needed for wheezing or shortness of breath.  Marland Kitchen amLODipine (NORVASC) 10 MG tablet Take 1 tablet (10 mg total) by mouth daily.  Marland Kitchen atorvastatin (LIPITOR) 20 MG tablet Take 1 tablet (20 mg total) by mouth daily.  Marland Kitchen levothyroxine (SYNTHROID) 100 MCG tablet Take 1 tablet (100 mcg total) by mouth daily.   No facility-administered encounter medications on file as of 03/09/2020.      Observations/Objective: No direct observation done as this was a telephone encounter. Lab Results  Component Value Date   WBC 11.9 (H) 02/16/2020   HGB 11.3 (L) 02/16/2020   HCT 33.9 (L) 02/16/2020   MCV 94.2 02/16/2020   PLT 247 02/16/2020     Chemistry      Component Value Date/Time   NA 138 02/16/2020 0341   K 4.3 02/16/2020 0341   CL 111 02/16/2020 0341   CO2 19 (L) 02/16/2020 0341   BUN 45 (H) 02/16/2020 0341   CREATININE 1.35 (H) 02/16/2020 0341      Component Value Date/Time   CALCIUM 8.3 (L) 02/16/2020 0341   ALKPHOS 92 02/16/2020 0341   AST 26 02/16/2020 0341   ALT 22 02/16/2020 0341   BILITOT 1.0 02/16/2020 0341       Assessment and Plan: 1. Hospital discharge follow-up  2. Pneumonia  due to COVID-19 virus 3. Acute hypoxemic respiratory failure [facility]) Patient clinically better.  We will have her come as an in person visit in 4 weeks.  At that time we will evaluate whether she still needs home oxygen.  I have encouraged her to get the COVID-19 vaccine.  4. Essential hypertension Continue amlodipine.  Advised to limit salt in the foods.  Advised to keep feet elevated when sitting down.  If swelling in the feet persists, we will have to discontinue amlodipine. - CBC; Future - Lipid panel; Future  5. Acquired hypothyroidism - levothyroxine (SYNTHROID) 100 MCG tablet; Take 1 tablet (100 mcg total)  by mouth daily.  Dispense: 30 tablet; Refill: 3  6. Stage 3b chronic kidney disease (HCC) Advised to avoid NSAIDs. - Comprehensive metabolic panel; Future  7. Edema of both feet See #4 above  8. Hyperlipidemia, unspecified hyperlipidemia type - atorvastatin (LIPITOR) 20 MG tablet; Take 1 tablet (20 mg total) by mouth daily.  Dispense: 30 tablet; Refill: 3   Follow Up Instructions: 4 wks   I discussed the assessment and treatment plan with the patient. The patient was provided an opportunity to ask questions and all were answered. The patient agreed with the plan and demonstrated an understanding of the instructions.   The patient was advised to call back or seek an in-person evaluation if the symptoms worsen or if the condition fails to improve as anticipated.  I provided 19 minutes of non-face-to-face time during this encounter.   Jonah Blue, MD

## 2020-04-13 ENCOUNTER — Other Ambulatory Visit: Payer: Self-pay

## 2020-04-13 ENCOUNTER — Ambulatory Visit: Payer: Self-pay | Attending: Internal Medicine | Admitting: Internal Medicine

## 2020-04-13 ENCOUNTER — Telehealth: Payer: Self-pay | Admitting: Internal Medicine

## 2020-04-13 ENCOUNTER — Encounter: Payer: Self-pay | Admitting: Internal Medicine

## 2020-04-13 VITALS — BP 121/78 | HR 89 | Resp 16 | Wt 168.4 lb

## 2020-04-13 DIAGNOSIS — R600 Localized edema: Secondary | ICD-10-CM

## 2020-04-13 DIAGNOSIS — E785 Hyperlipidemia, unspecified: Secondary | ICD-10-CM

## 2020-04-13 DIAGNOSIS — E039 Hypothyroidism, unspecified: Secondary | ICD-10-CM

## 2020-04-13 DIAGNOSIS — Z1211 Encounter for screening for malignant neoplasm of colon: Secondary | ICD-10-CM

## 2020-04-13 DIAGNOSIS — N1832 Chronic kidney disease, stage 3b: Secondary | ICD-10-CM

## 2020-04-13 DIAGNOSIS — Z2821 Immunization not carried out because of patient refusal: Secondary | ICD-10-CM

## 2020-04-13 DIAGNOSIS — I10 Essential (primary) hypertension: Secondary | ICD-10-CM

## 2020-04-13 DIAGNOSIS — Z8616 Personal history of COVID-19: Secondary | ICD-10-CM

## 2020-04-13 MED ORDER — HYDROCHLOROTHIAZIDE 12.5 MG PO TABS: 12.5000 mg | ORAL_TABLET | Freq: Every day | ORAL | 3 refills | Status: DC

## 2020-04-13 MED ORDER — AMLODIPINE BESYLATE 5 MG PO TABS: 5.0000 mg | ORAL_TABLET | Freq: Every day | ORAL | 6 refills | Status: DC

## 2020-04-13 NOTE — Progress Notes (Signed)
Patient ID: Amanda Blanchard, female    DOB: 23-Jan-1955  MRN: [id number]  CC: Hypertension   Subjective: Amanda Blanchard is a 65 y.o. female who presents for 1 month follow-up on hypertension and reevaluate ongoing need for home oxygen Her concerns today include:  Patient with history of HTN, HL, hypothyroidism, COVID pneumonia 02/2020, CKD 3   Hypoxic resp failure:  Eval 1 mth ago post hosp dischg for COVID pneumonia with hypoxic respiratory failure on home O2.  Not using O2 much any more.  No DOE.  No cough. -did not get COVID vaccine as yet.  States she plans to get it but not now.  "I don't know; I will think about it." Did not have blood tests done as ordered on last visit - CBC, chem, lipid, TSH. Will stop at lab today.   HTN: taking and tolerating Norvasc. Limits salt in foods.  No CP/SOB/PND/orthopnea. -endorses swelling in both legs x3 wks.  Occurs about every 3 days.   -Looking over chemistries from hospitalization earlier this year, her GFR ranged between 36-44 with creatinine ranged from 1.62-1.35.  She had acute kidney injury while in the hospital but is thought to have some baseline CKD.  She was taken off ACE inhibitor and HCTZ.  She is not on NSAIDs. -Taking and tolerating atorvastatin for cholesterol.  Hypothyroid:  No wgh changes.  No feeling hot/cold all the time.  No hair loss. Compliant with Levothyroxine.  HM:  Due for PAP/MMG and colon CA screen.  Never had c-scope.    Patient Active Problem List   Diagnosis Date Noted  . Influenza vaccine refused 04/13/2020  . Tetanus, diphtheria, and acellular pertussis (Tdap) vaccination declined 04/13/2020  . COVID-19 vaccine dose declined 04/13/2020  . Stage 3b chronic kidney disease (HCC) 03/09/2020  . Acute respiratory failure due to COVID-19 (HCC) 02/11/2020  . Essential hypertension 02/11/2020  . Hypothyroidism 02/11/2020  . HLD (hyperlipidemia) 02/11/2020     Current Outpatient Medications on File  Prior to Visit  Medication Sig Dispense Refill  . albuterol (VENTOLIN HFA) 108 (90 Base) MCG/ACT inhaler Inhale 2 puffs into the lungs every 6 (six) hours as needed for wheezing or shortness of breath. 6.7 g 0  . atorvastatin (LIPITOR) 20 MG tablet Take 1 tablet (20 mg total) by mouth daily. 30 tablet 3  . levothyroxine (SYNTHROID) 100 MCG tablet Take 1 tablet (100 mcg total) by mouth daily. 30 tablet 3   No current facility-administered medications on file prior to visit.    No Known Allergies  Social History   Socioeconomic History  . Marital status: Married    Spouse name: Not on file  . Number of children: Not on file  . Years of education: Not on file  . Highest education level: Not on file  Occupational History  . Not on file  Tobacco Use  . Smoking status: Never Smoker  . Smokeless tobacco: Never Used  Substance and Sexual Activity  . Alcohol use: Not Currently  . Drug use: Not Currently  . Sexual activity: Not on file  Other Topics Concern  . Not on file  Social History Narrative  . Not on file   Social Determinants of Health   Financial Resource Strain: Not on file  Food Insecurity: Not on file  Transportation Needs: Not on file  Physical Activity: Not on file  Stress: Not on file  Social Connections: Not on file  Intimate Partner Violence: Not on file  Family History  Problem Relation Age of Onset  . Diabetes Mellitus II Neg Hx     No past surgical history on file.  ROS: Review of Systems Negative except as stated above  PHYSICAL EXAM: BP 121/78   Pulse 89   Resp 16   Wt 168 lb 6.4 oz (76.4 kg)   SpO2 96%   BMI 29.83 kg/m   Wt Readings from Last 3 Encounters:  04/13/20 168 lb 6.4 oz (76.4 kg)  02/11/20 170 lb (77.1 kg)  Pox 96-98% ambulation RA Pox 96-97 at rest RA   Physical Exam  General appearance - alert, well appearing, middle-aged older appearing Hispanic female and in no distress Mental status - normal mood, behavior, speech,  dress, motor activity, and thought processes Neck - supple, no significant adenopathy Chest - clear to auscultation, no wheezes, rales or rhonchi, symmetric air entry Heart - normal rate, regular rhythm, normal S1, S2, no murmurs, rubs, clicks or gallops Extremities -1+ lower extremity edema right greater than left   CMP Latest Ref Rng & Units 02/16/2020 02/15/2020 02/14/2020  Glucose 70 - 99 mg/dL 627(O) 350(K) 938(H)  BUN 8 - 23 mg/dL 82(X) 93(Z) 16(R)  Creatinine 0.44 - 1.00 mg/dL 6.78(L) 3.81(O) 1.75(Z)  Sodium 135 - 145 mmol/L 138 139 140  Potassium 3.5 - 5.1 mmol/L 4.3 4.3 4.0  Chloride 98 - 111 mmol/L 111 112(H) 113(H)  CO2 22 - 32 mmol/L 19(L) 19(L) 18(L)  Calcium 8.9 - 10.3 mg/dL 8.3(L) 8.1(L) 8.2(L)  Total Protein 6.5 - 8.1 g/dL 5.2(L) 5.5(L) 6.0(L)  Total Bilirubin 0.3 - 1.2 mg/dL 1.0 0.7 0.8  Alkaline Phos 38 - 126 U/L 92 104 111  AST 15 - 41 U/L 26 33 44(H)  ALT 0 - 44 U/L 22 24 28    Lipid Panel     Component Value Date/Time   TRIG 123 02/11/2020 0105    CBC    Component Value Date/Time   WBC 11.9 (H) 02/16/2020 0341   RBC 3.60 (L) 02/16/2020 0341   HGB 11.3 (L) 02/16/2020 0341   HCT 33.9 (L) 02/16/2020 0341   PLT 247 02/16/2020 0341   MCV 94.2 02/16/2020 0341   MCH 31.4 02/16/2020 0341   MCHC 33.3 02/16/2020 0341   RDW 14.4 02/16/2020 0341   LYMPHSABS 0.8 02/16/2020 0341   MONOABS 0.9 02/16/2020 0341   EOSABS 0.0 02/16/2020 0341   BASOSABS 0.0 02/16/2020 0341    ASSESSMENT AND PLAN: 1. Essential hypertension Her lower extremity edema may be due to amlodipine as it coincides with the time that it was initiated.  I recommend decrease the dose to 5 mg daily.  Restart hydrochlorothiazide at 12.5 mg.  Check baseline labs today.  Advised to limit salt in the foods. - hydrochlorothiazide (HYDRODIURIL) 12.5 MG tablet; Take 1 tablet (12.5 mg total) by mouth daily.  Dispense: 90 tablet; Refill: 3 - amLODipine (NORVASC) 5 MG tablet; Take 1 tablet (5 mg total) by  mouth daily.  Dispense: 30 tablet; Refill: 6 - CBC - Comprehensive metabolic panel - Lipid panel  2. Edema of both legs See #1 above - Brain natriuretic peptide - hydrochlorothiazide (HYDRODIURIL) 12.5 MG tablet; Take 1 tablet (12.5 mg total) by mouth daily.  Dispense: 90 tablet; Refill: 3  3. Stage 3b chronic kidney disease (HCC) Recheck kidney function today with blood test.  Avoid NSAIDs.  4. Acquired hypothyroidism Continue levothyroxine.  Advised patient to make sure take this medication in the morning 1 hour prior to breakfast and  other oral medications - TSH  5. Hyperlipidemia, unspecified hyperlipidemia type Continue atorvastatin  6. Influenza vaccine refused Offered and recommended.  Patient declined.  7. Tetanus, diphtheria, and acellular pertussis (Tdap) vaccination declined Offered and recommended.  Patient declined  8. COVID-19 vaccine dose declined Discussed and encourage getting the COVID-19 vaccines.  Informed patient that the vaccines are not 100% effective but should she get Covid infection again hopefully it would not be as severe.  Patient declines.  9. Screening for colon cancer Discussed colon cancer screening recommendations and methods of screening.  She is agreeable to doing the fit test. - Fecal occult blood, imunochemical([facility])  10. History of COVID-19 Her oxygen level is good.  We can discontinue home oxygen.  I will have my CMA call the company from which she is getting the O2 and will fax them a discontinuation order.     Patient was given the opportunity to ask questions.  Patient verbalized understanding of the plan and was able to repeat key elements of the plan.  [employer] Language interpreter used during this encounter. #[id number] Man  Orders Placed This Encounter  Procedures  . Fecal occult blood, imunochemical([facility])  . Brain natriuretic peptide  . CBC  . Comprehensive metabolic panel  . Lipid panel  . TSH      Requested Prescriptions   Signed Prescriptions Disp Refills  . hydrochlorothiazide (HYDRODIURIL) 12.5 MG tablet 90 tablet 3    Sig: Take 1 tablet (12.5 mg total) by mouth daily.  Marland Kitchen amLODipine (NORVASC) 5 MG tablet 30 tablet 6    Sig: Take 1 tablet (5 mg total) by mouth daily.    Return in about 6 weeks (around 05/25/2020) for PAP/well woman exam.  Jonah Blue, MD, FACP

## 2020-04-13 NOTE — Patient Instructions (Signed)
Hipertensin en los adultos Hypertension, Adult La presin arterial alta (hipertensin) se produce cuando la fuerza de la sangre bombea a travs de las arterias con mucha fuerza. Las arterias son los vasos sanguneos que transportan la sangre desde el corazn al resto del cuerpo. La hipertensin hace que el corazn haga ms esfuerzo para bombear sangre y puede provocar que las arterias se estrechen o endurezcan. La hipertensin no tratada o no controlada puede causar infarto de miocardio, insuficiencia cardaca, accidente cerebrovascular, enfermedad renal y otros problemas. Una lectura de la presin arterial consta de un nmero ms alto sobre un nmero ms bajo. En condiciones ideales, la presin arterial debe estar por debajo de 120/80. El primer nmero ("superior") es la presin sistlica. Es la medida de la presin de las arterias cuando el corazn late. El segundo nmero ("inferior") es la presin diastlica. Es la medida de la presin en las arterias cuando el corazn se relaja. Cules son las causas? Se desconoce la causa exacta de esta afeccin. Hay algunas afecciones que causan presin arterial alta o estn relacionadas con ella. Qu incrementa el riesgo? Algunos factores de riesgo de hipertensin estn bajo su control. Los siguientes factores pueden hacer que sea ms propenso a desarrollar esta afeccin:  Fumar.  Tener diabetes mellitus tipo 2, colesterol alto, o ambos.  No hacer la cantidad suficiente de actividad fsica o ejercicio.  Tener sobrepeso.  Consumir mucha grasa, azcar, caloras o sal (sodio) en su dieta.  Beber alcohol en exceso. Algunos factores de riesgo para la presin arterial alta pueden ser difciles o imposibles de cambiar. Algunos de estos factores son los siguientes:  Tener enfermedad renal crnica.  Tener antecedentes familiares de presin arterial alta.  Edad. Los riesgos aumentan con la edad.  Raza. El riesgo es mayor para las personas  afroamericanas.  Sexo. Antes de los 45aos, los hombres corren ms riesgo que las mujeres. Despus de los 65aos, las mujeres corren ms riesgo que los hombres.  Tener apnea obstructiva del sueo.  Estrs. Cules son los signos o los sntomas? Es posible que la presin arterial alta puede no cause sntomas. La presin arterial muy alta (crisis hipertensiva) puede provocar:  Dolor de cabeza.  Ansiedad.  Falta de aire.  Hemorragia nasal.  Nuseas y vmitos.  Cambios en la visin.  Dolor de pecho intenso.  Convulsiones. Cmo se diagnostica? Esta afeccin se diagnostica al medir su presin arterial mientras se encuentra sentado, con el brazo apoyado sobre una superficie plana, las piernas sin cruzar y los pies bien apoyados en el piso. El brazalete del tensimetro debe colocarse directamente sobre la piel de la parte superior del brazo y al nivel de su corazn. Debe medirla al menos dos veces en el mismo brazo. Determinadas condiciones pueden causar una diferencia de presin arterial entre el brazo izquierdo y el derecho. Ciertos factores pueden provocar que las lecturas de la presin arterial sean inferiores o superiores a lo normal por un perodo corto de tiempo:  Si su presin arterial es ms alta cuando se encuentra en el consultorio del mdico que cuando la mide en su hogar, se denomina "hipertensin de bata blanca". La mayora de las personas que tienen esta afeccin no deben ser medicadas.  Si su presin arterial es ms alta en el hogar que cuando se encuentra en el consultorio del mdico, se denomina "hipertensin enmascarada". La mayora de las personas que tienen esta afeccin deben ser medicadas para controlar la presin arterial. Si tiene una lecturas de presin arterial alta durante   una visita o si tiene presin arterial normal con otros factores de riesgo, se le podr pedir que haga lo siguiente:  Que regrese otro da para volver a controlar su presin arterial  nuevamente.  Que se controle la presin arterial en su casa durante 1 semana o ms. Si se le diagnostica hipertensin, es posible que se le realicen otros anlisis de sangre o estudios de diagnstico por imgenes para ayudar a su mdico a comprender su riesgo general de tener otras afecciones. Cmo se trata? Esta afeccin se trata haciendo cambios saludables en el estilo de vida, tales como ingerir alimentos saludables, realizar ms ejercicio y reducir el consumo de alcohol. El mdico puede recetarle medicamentos si los cambios en el estilo de vida no son suficientes para lograr controlar la presin arterial y si:  Su presin arterial sistlica est por encima de 130.  Su presin arterial diastlica est por encima de 80. La presin arterial deseada puede variar en funcin de las enfermedades, la edad y otros factores personales. Siga estas instrucciones en su casa: Comida y bebida  Siga una dieta con alto contenido de fibras y potasio, y con bajo contenido de sodio, azcar agregada y grasas. Un ejemplo de plan alimenticio es la dieta DASH (Dietary Approaches to Stop Hypertension, Mtodos alimenticios para detener la hipertensin). Para alimentarse de esta manera: ? Coma mucha fruta y verdura fresca. Trate de que la mitad del plato de cada comida sea de frutas y verduras. ? Coma cereales integrales, como pasta integral, arroz integral o pan integral. Llene aproximadamente un cuarto del plato con cereales integrales. ? Coma y beba productos lcteos con bajo contenido de grasa, como leche descremada o yogur bajo en grasas. ? Evite la ingesta de cortes de carne grasa, carne procesada o curada, y carne de ave con piel. Llene aproximadamente un cuarto del plato con protenas magras, como pescado, pollo sin piel, frijoles, huevos o tofu. ? Evite ingerir alimentos prehechos y procesados. En general, estos tienen mayor cantidad de sodio, azcar agregada y grasa.  Reduzca su ingesta diaria de sodio. La  mayora de las personas que tienen hipertensin deben comer menos de 1500 mg de sodio por da.  No beba alcohol si: ? Su mdico le indica no hacerlo. ? Est embarazada, puede estar embarazada o est tratando de quedar embarazada.  Si bebe alcohol: ? Limite la cantidad que bebe a lo siguiente:  De 0 a 1 medida por da para las mujeres.  De 0 a 2 medidas por da para los hombres. ? Est atento a la cantidad de alcohol que hay en las bebidas que toma. En [place], una medida equivale a una botella de cerveza de 12oz (355ml), un vaso de vino de 5oz (148ml) o un vaso de una bebida alcohlica de alta graduacin de 1oz (44ml).   Estilo de vida  Trabaje con su mdico para mantener un peso saludable o perder peso. Pregntele cul es el peso recomendado para usted.  Haga al menos 30minutos de ejercicio la mayora de los das de la semana. Estas actividades pueden incluir caminar, nadar o andar en bicicleta.  Incluya ejercicios para fortalecer sus msculos (ejercicios de resistencia), como Pilates o levantamiento de pesas, como parte de su rutina semanal de ejercicios. Intente realizar 30minutos de este tipo de ejercicios al menos tres das a la semana.  No consuma ningn producto que contenga nicotina o tabaco, como cigarrillos, cigarrillos electrnicos y tabaco de mascar. Si necesita ayuda para dejar de fumar, consulte al   mdico.  Contrlese la presin arterial en su casa segn las indicaciones del mdico.  Concurra a todas las visitas de seguimiento como se lo haya indicado el mdico. Esto es importante.   Medicamentos  Tome los medicamentos de venta libre y los recetados solamente como se lo haya indicado el mdico. Siga cuidadosamente las indicaciones. Los medicamentos para la presin arterial deben tomarse segn las indicaciones.  No omita las dosis de medicamentos para la presin arterial. Si lo hace, estar en riesgo de tener problemas y puede hacer que los medicamentos  sean menos eficaces.  Pregntele a su mdico a qu efectos secundarios o reacciones a los medicamentos debe prestar atencin. Comunquese con un mdico si:  Piensa que tiene una reaccin a un medicamento que est tomando.  Tiene dolores de cabeza frecuentes (recurrentes).  Se siente mareado.  Tiene hinchazn en los tobillos.  Tiene problemas de visin. Solicite ayuda inmediatamente si:  Siente un dolor de cabeza intenso o confusin.  Siente debilidad inusual o adormecimiento.  Siente que va a desmayarse.  Siente un dolor intenso en el pecho o el abdomen.  Vomita repetidas veces.  Tiene dificultad para respirar. Resumen  La hipertensin se produce cuando la sangre bombea en las arterias con mucha fuerza. Si esta afeccin no se controla, podra correr riesgo de tener complicaciones graves.  La presin arterial deseada puede variar en funcin de las enfermedades, la edad y otros factores personales. Para la mayora de las personas, una presin arterial normal es menor que 120/80.  La hipertensin se trata con cambios en el estilo de vida, medicamentos o una combinacin de ambos. Los cambios en el estilo de vida incluyen prdida de peso, ingerir alimentos sanos, seguir una dieta baja en sodio, hacer ms ejercicio y limitar el consumo de alcohol. Esta informacin no tiene como fin reemplazar el consejo del mdico. Asegrese de hacerle al mdico cualquier pregunta que tenga. Document Revised: 10/18/2017 Document Reviewed: 10/18/2017 Elsevier Patient Education  2021 Elsevier Inc.  

## 2020-04-13 NOTE — Telephone Encounter (Signed)
Pt received o2 from [employer] faxed order to discontinue

## 2020-04-14 ENCOUNTER — Telehealth: Payer: Self-pay | Admitting: Internal Medicine

## 2020-04-14 DIAGNOSIS — R7989 Other specified abnormal findings of blood chemistry: Secondary | ICD-10-CM

## 2020-04-14 DIAGNOSIS — R945 Abnormal results of liver function studies: Secondary | ICD-10-CM

## 2020-04-14 LAB — BRAIN NATRIURETIC PEPTIDE: BNP: 79.5 pg/mL (ref 0.0–100.0)

## 2020-04-14 LAB — LIPID PANEL
Chol/HDL Ratio: 5.8 ratio — ABNORMAL HIGH (ref 0.0–4.4)
Cholesterol, Total: 127 mg/dL (ref 100–199)
HDL: 22 mg/dL — ABNORMAL LOW (ref >39)
LDL Chol Calc (NIH): 79 mg/dL (ref 0–99)
Triglycerides: 144 mg/dL (ref 0–149)
VLDL Cholesterol Cal: 26 mg/dL (ref 5–40)

## 2020-04-14 LAB — COMPREHENSIVE METABOLIC PANEL
ALT: 589 IU/L (ref 0–32)
AST: 1188 IU/L (ref 0–40)
Albumin/Globulin Ratio: 0.9 — ABNORMAL LOW (ref 1.2–2.2)
Albumin: 3.8 g/dL (ref 3.8–4.8)
Alkaline Phosphatase: 716 IU/L — ABNORMAL HIGH (ref 44–121)
BUN/Creatinine Ratio: 17 (ref 12–28)
BUN: 19 mg/dL (ref 8–27)
Bilirubin Total: 3.2 mg/dL — ABNORMAL HIGH (ref 0.0–1.2)
CO2: 19 mmol/L — ABNORMAL LOW (ref 20–29)
Calcium: 9.8 mg/dL (ref 8.7–10.3)
Chloride: 108 mmol/L — ABNORMAL HIGH (ref 96–106)
Creatinine, Ser: 1.13 mg/dL — ABNORMAL HIGH (ref 0.57–1.00)
Globulin, Total: 4.3 g/dL (ref 1.5–4.5)
Glucose: 92 mg/dL (ref 65–99)
Potassium: 3.7 mmol/L (ref 3.5–5.2)
Sodium: 142 mmol/L (ref 134–144)
Total Protein: 8.1 g/dL (ref 6.0–8.5)
eGFR: 55 mL/min/{1.73_m2} — ABNORMAL LOW (ref >59)

## 2020-04-14 LAB — CBC
Hematocrit: 40.8 % (ref 34.0–46.6)
Hemoglobin: 13.2 g/dL (ref 11.1–15.9)
MCH: 30.1 pg (ref 26.6–33.0)
MCHC: 32.4 g/dL (ref 31.5–35.7)
MCV: 93 fL (ref 79–97)
Platelets: 211 10*3/uL (ref 150–450)
RBC: 4.39 x10E6/uL (ref 3.77–5.28)
RDW: 14 % (ref 11.7–15.4)
WBC: 8.2 10*3/uL (ref 3.4–10.8)

## 2020-04-14 LAB — TSH: TSH: 4.66 u[IU]/mL — ABNORMAL HIGH (ref 0.450–4.500)

## 2020-04-14 NOTE — Telephone Encounter (Signed)
Phone call placed to patient today using [employer] interpreters.  Interpreter is Cletus Gash #[id number]. Patient informed that her liver function test came back significantly abnormal. She denies any yellowing of the skin or eyes, abdominal pain, nausea/vomiting or fever.  She does not drink alcohol.  She is not using any herbal or unprescribed medicines.  She is on atorvastatin. -Advised that the atorvastatin may be causing the issue.  Told to stop atorvastatin immediately. -Advised to return to the lab today or tomorrow to have additional blood test for further evaluation especially to screen for acute hepatitis.  Also request that she returns to the lab again in 1 week after she has been off the atorvastatin for 1 week.  Patient expressed understanding of the plan and all questions were answered.  Further management will be based on results.  Message sent to scheduler to get her in with me in 1 week.  Results for orders placed or performed in visit on 04/13/20  Brain natriuretic peptide  Result Value Ref Range   BNP WILL FOLLOW   CBC  Result Value Ref Range   WBC 8.2 3.4 - 10.8 x10E3/uL   RBC 4.39 3.77 - 5.28 x10E6/uL   Hemoglobin 13.2 11.1 - 15.9 g/dL   Hematocrit 40.8 34.0 - 46.6 %   MCV 93 79 - 97 fL   MCH 30.1 26.6 - 33.0 pg   MCHC 32.4 31.5 - 35.7 g/dL   RDW 14.0 11.7 - 15.4 %   Platelets 211 150 - 450 x10E3/uL  Comprehensive metabolic panel  Result Value Ref Range   Glucose 92 65 - 99 mg/dL   BUN 19 8 - 27 mg/dL   Creatinine, Ser 1.13 (H) 0.57 - 1.00 mg/dL   eGFR 55 (L) >59 mL/min/1.73   BUN/Creatinine Ratio 17 12 - 28   Sodium 142 134 - 144 mmol/L   Potassium 3.7 3.5 - 5.2 mmol/L   Chloride 108 (H) 96 - 106 mmol/L   CO2 19 (L) 20 - 29 mmol/L   Calcium 9.8 8.7 - 10.3 mg/dL   Total Protein 8.1 6.0 - 8.5 g/dL   Albumin 3.8 3.8 - 4.8 g/dL   Globulin, Total 4.3 1.5 - 4.5 g/dL   Albumin/Globulin Ratio 0.9 (L) 1.2 - 2.2   Bilirubin Total 3.2 (H) 0.0 - 1.2 mg/dL   Alkaline  Phosphatase 716 (H) 44 - 121 IU/L   AST 1,188 (HH) 0 - 40 IU/L   ALT 589 (HH) 0 - 32 IU/L  Lipid panel  Result Value Ref Range   Cholesterol, Total 127 100 - 199 mg/dL   Triglycerides 144 0 - 149 mg/dL   HDL 22 (L) >39 mg/dL   VLDL Cholesterol Cal 26 5 - 40 mg/dL   LDL Chol Calc (NIH) 79 0 - 99 mg/dL   Chol/HDL Ratio 5.8 (H) 0.0 - 4.4 ratio  TSH  Result Value Ref Range   TSH 4.660 (H) 0.450 - 4.500 uIU/mL

## 2020-04-15 ENCOUNTER — Ambulatory Visit: Payer: Self-pay | Attending: Internal Medicine

## 2020-04-15 ENCOUNTER — Other Ambulatory Visit: Payer: Self-pay

## 2020-04-15 DIAGNOSIS — I10 Essential (primary) hypertension: Secondary | ICD-10-CM

## 2020-04-15 DIAGNOSIS — R7989 Other specified abnormal findings of blood chemistry: Secondary | ICD-10-CM

## 2020-04-15 DIAGNOSIS — N1832 Chronic kidney disease, stage 3b: Secondary | ICD-10-CM

## 2020-04-15 DIAGNOSIS — R945 Abnormal results of liver function studies: Secondary | ICD-10-CM

## 2020-04-16 ENCOUNTER — Other Ambulatory Visit: Payer: Self-pay | Admitting: Internal Medicine

## 2020-04-16 DIAGNOSIS — R7989 Other specified abnormal findings of blood chemistry: Secondary | ICD-10-CM

## 2020-04-16 DIAGNOSIS — R945 Abnormal results of liver function studies: Secondary | ICD-10-CM

## 2020-04-16 NOTE — Progress Notes (Signed)
Let patient know that screening for viral hepatitis that can affect the liver is negative.  I am waiting for a few other tests to be resulted.  Liver function tests are still abnormally elevated but have decreased some.  Please return to the lab again next week to have liver function tests checked again to make sure that levels continue to come down back towards normal.

## 2020-04-18 LAB — COMPREHENSIVE METABOLIC PANEL
ALT: 473 IU/L — ABNORMAL HIGH (ref 0–32)
AST: 865 IU/L (ref 0–40)
Albumin/Globulin Ratio: 0.9 — ABNORMAL LOW (ref 1.2–2.2)
Albumin: 3.5 g/dL — ABNORMAL LOW (ref 3.8–4.8)
Alkaline Phosphatase: 651 IU/L — ABNORMAL HIGH (ref 44–121)
BUN/Creatinine Ratio: 12 (ref 12–28)
BUN: 13 mg/dL (ref 8–27)
Bilirubin Total: 2.8 mg/dL — ABNORMAL HIGH (ref 0.0–1.2)
CO2: 18 mmol/L — ABNORMAL LOW (ref 20–29)
Calcium: 9.3 mg/dL (ref 8.7–10.3)
Chloride: 108 mmol/L — ABNORMAL HIGH (ref 96–106)
Creatinine, Ser: 1.06 mg/dL — ABNORMAL HIGH (ref 0.57–1.00)
Globulin, Total: 4 g/dL (ref 1.5–4.5)
Glucose: 86 mg/dL (ref 65–99)
Potassium: 3.6 mmol/L (ref 3.5–5.2)
Sodium: 143 mmol/L (ref 134–144)
Total Protein: 7.5 g/dL (ref 6.0–8.5)
eGFR: 59 mL/min/{1.73_m2} — ABNORMAL LOW (ref >59)

## 2020-04-18 LAB — CBC
Hematocrit: 37.9 % (ref 34.0–46.6)
Hemoglobin: 12.9 g/dL (ref 11.1–15.9)
MCH: 30.8 pg (ref 26.6–33.0)
MCHC: 34 g/dL (ref 31.5–35.7)
MCV: 91 fL (ref 79–97)
Platelets: 221 10*3/uL (ref 150–450)
RBC: 4.19 x10E6/uL (ref 3.77–5.28)
RDW: 13.7 % (ref 11.7–15.4)
WBC: 7.2 10*3/uL (ref 3.4–10.8)

## 2020-04-18 LAB — MITOCHONDRIAL/SMOOTH MUSCLE AB PNL
Mitochondrial Ab: <20 Units (ref 0.0–20.0)
Smooth Muscle Ab: 18 Units (ref 0–19)

## 2020-04-18 LAB — LIPID PANEL
Chol/HDL Ratio: 6.1 ratio — ABNORMAL HIGH (ref 0.0–4.4)
Cholesterol, Total: 128 mg/dL (ref 100–199)
HDL: 21 mg/dL — ABNORMAL LOW (ref >39)
LDL Chol Calc (NIH): 80 mg/dL (ref 0–99)
Triglycerides: 150 mg/dL — ABNORMAL HIGH (ref 0–149)
VLDL Cholesterol Cal: 27 mg/dL (ref 5–40)

## 2020-04-18 LAB — HEPATITIS PANEL, ACUTE
Hep A IgM: NEGATIVE
Hep B C IgM: NEGATIVE
Hep C Virus Ab: <0.1 s/co ratio (ref 0.0–0.9)
Hepatitis B Surface Ag: NEGATIVE

## 2020-04-21 ENCOUNTER — Telehealth: Payer: Self-pay

## 2020-04-21 NOTE — Telephone Encounter (Signed)
[employer] Norva Pavlov  Id# [id number]  contacted pt to go over lab results pt is aware and has been scheduled a lab appt for Friday 4/8

## 2020-04-23 ENCOUNTER — Other Ambulatory Visit: Payer: Self-pay

## 2020-04-23 ENCOUNTER — Ambulatory Visit: Payer: Self-pay

## 2020-04-23 ENCOUNTER — Ambulatory Visit: Payer: Self-pay | Attending: Internal Medicine

## 2020-04-23 DIAGNOSIS — R7989 Other specified abnormal findings of blood chemistry: Secondary | ICD-10-CM

## 2020-04-23 DIAGNOSIS — R945 Abnormal results of liver function studies: Secondary | ICD-10-CM

## 2020-04-24 ENCOUNTER — Telehealth: Payer: Self-pay | Admitting: Internal Medicine

## 2020-04-24 ENCOUNTER — Other Ambulatory Visit: Payer: Self-pay | Admitting: Internal Medicine

## 2020-04-24 DIAGNOSIS — R7989 Other specified abnormal findings of blood chemistry: Secondary | ICD-10-CM

## 2020-04-24 DIAGNOSIS — R945 Abnormal results of liver function studies: Secondary | ICD-10-CM

## 2020-04-24 LAB — HEPATIC FUNCTION PANEL
ALT: 531 IU/L (ref 0–32)
AST: 1156 IU/L (ref 0–40)
Albumin: 4 g/dL (ref 3.8–4.8)
Alkaline Phosphatase: 612 IU/L — ABNORMAL HIGH (ref 44–121)
Bilirubin Total: 3 mg/dL — ABNORMAL HIGH (ref 0.0–1.2)
Bilirubin, Direct: 1.92 mg/dL — ABNORMAL HIGH (ref 0.00–0.40)
Total Protein: 8.1 g/dL (ref 6.0–8.5)

## 2020-04-24 NOTE — Telephone Encounter (Signed)
Phone call placed to patient this afternoon using [facility] interpreter services [place] ID [phone]). Patient informed that her liver function test remains abnormal.  Elevation is in a pattern of alcoholic hepatitis or alcoholic cirrhosis.  I confirm with patient again that she does not drink alcoholic beverages at all.  She is off the atorvastatin.  I asked about the levothyroxine.  Patient states she has been on this medication for several years. -Screen for acute hepatitis has been negative.  Antimitochondrial and anti-smooth muscle antibodies are negative. -Advised that next step is to get liver ultrasound and to refer to a gastroenterologist as soon as possible for further evaluation.  Patient denies any abdominal pain at this time. -Patient told me she met with our financial counselor Clifton James this past Friday and filled out some paperwork but she is not sure whether it was for the orange card/cone discount card.  I told her to touch base with him again this upcoming week to make sure that it was application for the orange card/cone discount as she will needed to see the specialist if she does not have insurance. Message sent to my CMA to schedule liver ultrasound as soon as possible. Message sent to referral coordinator requesting to get her in with the gastroenterologist as soon as possible.  Lab Results  Component Value Date   ALT 531 (HH) 04/23/2020   AST 1,156 (HH) 04/23/2020   ALKPHOS 612 (H) 04/23/2020   BILITOT 3.0 (H) 04/23/2020

## 2020-04-26 NOTE — Telephone Encounter (Signed)
Contacted pt and made aware  

## 2020-04-26 NOTE — Telephone Encounter (Signed)
Mikle Bosworth could you please reach out to pt and make her aware that her Ultrasound is scheduled for 04/27/20 at 1130am at [facility]. Pt will need to arrive at 1115am. Pt can't eat or drink after 12am

## 2020-04-27 ENCOUNTER — Other Ambulatory Visit: Payer: Self-pay

## 2020-04-27 ENCOUNTER — Ambulatory Visit (HOSPITAL_COMMUNITY)
Admission: RE | Admit: 2020-04-27 | Discharge: 2020-04-27 | Disposition: A | Discharge status: Home / Self Care | Payer: Self-pay | Source: Ambulatory Visit | Attending: Internal Medicine | Admitting: Internal Medicine

## 2020-04-27 DIAGNOSIS — R7989 Other specified abnormal findings of blood chemistry: Secondary | ICD-10-CM

## 2020-04-27 DIAGNOSIS — R945 Abnormal results of liver function studies: Secondary | ICD-10-CM | POA: Insufficient documentation

## 2020-04-28 ENCOUNTER — Other Ambulatory Visit: Payer: Self-pay | Admitting: Internal Medicine

## 2020-04-28 ENCOUNTER — Telehealth: Payer: Self-pay | Admitting: Internal Medicine

## 2020-04-28 DIAGNOSIS — R945 Abnormal results of liver function studies: Secondary | ICD-10-CM

## 2020-04-28 DIAGNOSIS — R7989 Other specified abnormal findings of blood chemistry: Secondary | ICD-10-CM

## 2020-04-28 DIAGNOSIS — K760 Fatty (change of) liver, not elsewhere classified: Secondary | ICD-10-CM

## 2020-04-28 NOTE — Progress Notes (Signed)
Open in error

## 2020-04-28 NOTE — Telephone Encounter (Signed)
PC placed to pt today using [employer] [phone].  Patient informed that I received the results of the ultrasound of the abdomen.  It shows that she has a small polyp in the gallbladder.  However I doubt this is causing the abnormal liver function test.  Mention is made in the report of diffuse increased echogenicity of the liver parenchyma.  I called and spoke with one of the radiologist today and [facility] radiology to inquire whether changes seen in the liver seem consistent with cirrhosis.  He stated that on review of the images there does seem to be some nodularities that could be consistent with cirrhosis.  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   I recommend that we get a CAT scan for better imaging and will also give [place] a view of the pancreas to make sure there is no compressive lesion lesion there.  Will have my CMA set up.  Message sent to referral co-ordinator inquiring about GI referral.

## 2020-04-29 NOTE — Telephone Encounter (Signed)
Please contact pt with appt information  CT is scheduled for May 06, 2020 at [facility] at 230pm. Pt will need to arrive at 215pm. Pt can not eat or drink anything 4 hours prior.

## 2020-04-29 NOTE — Telephone Encounter (Signed)
I call the Pt, she was inform of the CT scan that need to be done for 04/121/22, Pt understood

## 2020-05-06 ENCOUNTER — Ambulatory Visit (HOSPITAL_COMMUNITY)
Admission: RE | Admit: 2020-05-06 | Discharge: 2020-05-06 | Disposition: A | Discharge status: Home / Self Care | Payer: Self-pay | Source: Ambulatory Visit | Attending: Internal Medicine | Admitting: Internal Medicine

## 2020-05-06 ENCOUNTER — Other Ambulatory Visit: Payer: Self-pay

## 2020-05-06 ENCOUNTER — Telehealth: Payer: Self-pay | Admitting: Internal Medicine

## 2020-05-06 ENCOUNTER — Other Ambulatory Visit: Payer: Self-pay | Admitting: Internal Medicine

## 2020-05-06 DIAGNOSIS — R945 Abnormal results of liver function studies: Secondary | ICD-10-CM | POA: Insufficient documentation

## 2020-05-06 DIAGNOSIS — K760 Fatty (change of) liver, not elsewhere classified: Secondary | ICD-10-CM

## 2020-05-06 DIAGNOSIS — R7989 Other specified abnormal findings of blood chemistry: Secondary | ICD-10-CM

## 2020-05-06 MED ORDER — IOHEXOL 350 MG/ML SOLN
100.0000 mL | Freq: Once | INTRAVENOUS | Status: AC | PRN
  Administered 2020-05-06: 100 mL via INTRAVENOUS

## 2020-05-06 MED ORDER — IOHEXOL 9 MG/ML PO SOLN
500.0000 mL | ORAL | Status: AC
  Administered 2020-05-06 (×2): 500 mL via ORAL

## 2020-05-06 NOTE — Telephone Encounter (Signed)
Will forward to provider  

## 2020-05-06 NOTE — Telephone Encounter (Signed)
Done

## 2020-05-06 NOTE — Telephone Encounter (Signed)
Danda with [facility] ct dept is calling and they do not do ct limited. Please change to ct abd with contrast. Pt is sch for  ct scan today

## 2020-05-07 ENCOUNTER — Other Ambulatory Visit: Payer: Self-pay | Admitting: Internal Medicine

## 2020-05-07 DIAGNOSIS — K7460 Unspecified cirrhosis of liver: Secondary | ICD-10-CM

## 2020-05-07 NOTE — Progress Notes (Signed)
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  This means that the liver is permanently damaged.  She needs to see the gastrointestinal specialist to figure out what has caused it and further management.  The gastroenterologist will be calling her with an appointment.  They had called her before but she declined the appointment.

## 2020-05-14 ENCOUNTER — Telehealth: Payer: Self-pay | Admitting: Internal Medicine

## 2020-05-14 NOTE — Telephone Encounter (Signed)
-----   Message from Dionne Bucy sent at 05/14/2020  4:40 PM EDT ----- Regarding: RE: Gastroenterology referral I called [facility]  and they said that  4/26 Unable to leave msg on pt's vm (vm not setup)and they will try again   ----- Message ----- From: Marcine Matar, MD Sent: 05/05/2020  11:02 AM EDT To: Dionne Bucy Subject: RE: Gastroenterology referral                  Can you please reach out to [facility] again on this pt.  Does not look like they have called her back to get her scheduled to be seen. ----- Message ----- From: Dionne Bucy Sent: 04/28/2020   4:34 PM EDT To: Marcine Matar, MD Subject: RE: Gastroenterology referral                  Noted   Spoke to patient  and  I send message to L[facility]  Gi   ----- Message ----- From: Marcine Matar, MD Sent: 04/28/2020   2:15 PM EDT To: Dionne Bucy Subject: RE: Gastroenterology referral                  Arna Medici: Could you please call this patient and let her know that the gastroenterologist is the specialist to whom I am referring her for further evaluation of the abnormal liver function.  I spent half this past Saturday morning speaking with her through an interpreter the need for imaging studies and for her to see the gastroenterologist specialist.  Once you have spoke with her, please let [facility] know so that they can go ahead and schedule her. ----- Message ----- From: Dionne Bucy Sent: 04/28/2020  12:34 PM EDT To: Marcine Matar, MD Subject: RE: Gastroenterology referral                  Good Afternoon  Dr  Vinnie Level  Contact  patient  and  her respond  . Spoke with pt, she was not aware of referral and prefers to speak with referring provider to clarify and will call us back.   ----- Message ----- From: Marcine Matar, MD Sent: 04/24/2020   3:56 PM EDT To: Dionne Bucy Subject: Gastroenterology referral                      Please try to get this patient in ASAP with the gastroenterologist for  significantly abnormal LFTs.

## 2020-05-21 ENCOUNTER — Encounter: Payer: Self-pay | Admitting: Physician Assistant

## 2020-06-10 ENCOUNTER — Ambulatory Visit: Payer: Self-pay | Admitting: Physician Assistant

## 2020-06-11 ENCOUNTER — Telehealth: Payer: Self-pay | Admitting: Internal Medicine

## 2020-06-11 NOTE — Telephone Encounter (Signed)
-----   Message from Sammuel Cooper, PA-C sent at 06/10/2020  4:24 PM EDT ----- Regarding: no show Dr. Avon Gully an FYI this patient that you have referred for significantly elevated LFTs no showed for appointment today  Beth,-lets reach out to patient in the next few days to try to get her rescheduled for an office visit with any available provider for elevated LFTs -thank you

## 2020-06-16 ENCOUNTER — Telehealth: Payer: Self-pay

## 2020-06-16 NOTE — Telephone Encounter (Signed)
Patient did not keep her appointment scheduled for 06/10/20. Spanish speaking staff called and left a message to call back about this and we will get her rescheduled.

## 2020-06-17 ENCOUNTER — Ambulatory Visit: Payer: Self-pay | Admitting: Internal Medicine

## 2020-07-20 NOTE — Telephone Encounter (Signed)
Patient has been called numerous times and I have left messages for her to call back to reschedule.

## 2020-07-21 ENCOUNTER — Encounter (INDEPENDENT_AMBULATORY_CARE_PROVIDER_SITE_OTHER): Payer: Self-pay

## 2020-08-17 ENCOUNTER — Other Ambulatory Visit: Payer: Self-pay | Admitting: Internal Medicine

## 2020-08-17 DIAGNOSIS — E039 Hypothyroidism, unspecified: Secondary | ICD-10-CM

## 2020-08-17 MED ORDER — LEVOTHYROXINE SODIUM 100 MCG PO TABS: 100.0000 ug | ORAL_TABLET | Freq: Every day | ORAL | 3 refills | Status: DC

## 2020-08-17 NOTE — Telephone Encounter (Signed)
Medication Refill - Medication: levothyroxine (SYNTHROID) 100 MCG tablet   Has the patient contacted their pharmacy? yes (Agent: If no, request that the patient contact the pharmacy for the refill.) (Agent: If yes, when and what did the pharmacy advise?)pharmacy called in  Preferred Pharmacy (with phone number or street name): [facility] - [place], [place] - [address]  Phone:  [phone] Fax:  [phone]  Agent: Please be advised that RX refills may take up to 3 business days. We ask that you follow-up with your pharmacy.

## 2020-08-30 ENCOUNTER — Ambulatory Visit: Payer: Self-pay | Admitting: Internal Medicine

## 2020-08-31 ENCOUNTER — Other Ambulatory Visit (HOSPITAL_COMMUNITY)
Admission: RE | Admit: 2020-08-31 | Discharge: 2020-08-31 | Disposition: A | Discharge status: Home / Self Care | Payer: Self-pay | Source: Ambulatory Visit | Attending: Internal Medicine | Admitting: Internal Medicine

## 2020-08-31 ENCOUNTER — Ambulatory Visit: Payer: Self-pay | Attending: Internal Medicine | Admitting: Internal Medicine

## 2020-08-31 ENCOUNTER — Other Ambulatory Visit: Payer: Self-pay

## 2020-08-31 VITALS — BP 152/84 | HR 56 | Temp 97.9°F | Ht 63.0 in | Wt 169.8 lb

## 2020-08-31 DIAGNOSIS — Z28310 Unvaccinated for covid-19: Secondary | ICD-10-CM

## 2020-08-31 DIAGNOSIS — E039 Hypothyroidism, unspecified: Secondary | ICD-10-CM

## 2020-08-31 DIAGNOSIS — Z0000 Encounter for general adult medical examination without abnormal findings: Secondary | ICD-10-CM

## 2020-08-31 DIAGNOSIS — I700 Atherosclerosis of aorta: Secondary | ICD-10-CM

## 2020-08-31 DIAGNOSIS — K7460 Unspecified cirrhosis of liver: Secondary | ICD-10-CM

## 2020-08-31 DIAGNOSIS — E669 Obesity, unspecified: Secondary | ICD-10-CM

## 2020-08-31 DIAGNOSIS — M21611 Bunion of right foot: Secondary | ICD-10-CM

## 2020-08-31 DIAGNOSIS — K029 Dental caries, unspecified: Secondary | ICD-10-CM

## 2020-08-31 DIAGNOSIS — Z1231 Encounter for screening mammogram for malignant neoplasm of breast: Secondary | ICD-10-CM

## 2020-08-31 DIAGNOSIS — I10 Essential (primary) hypertension: Secondary | ICD-10-CM

## 2020-08-31 DIAGNOSIS — E66811 Obesity, class 1: Secondary | ICD-10-CM

## 2020-08-31 DIAGNOSIS — Z6830 Body mass index (BMI) 30.0-30.9, adult: Secondary | ICD-10-CM

## 2020-08-31 DIAGNOSIS — Z124 Encounter for screening for malignant neoplasm of cervix: Secondary | ICD-10-CM

## 2020-08-31 DIAGNOSIS — Z2821 Immunization not carried out because of patient refusal: Secondary | ICD-10-CM

## 2020-08-31 MED ORDER — LEVOTHYROXINE SODIUM 100 MCG PO TABS: 100.0000 ug | ORAL_TABLET | Freq: Every day | ORAL | 6 refills | Status: DC

## 2020-08-31 NOTE — Progress Notes (Addendum)
Patient ID: Amanda Blanchard, female    DOB: [birth year]  MRN: [id number]  CC: Physical   Subjective: Amanda Blanchard is a 65 y.o. female who presents for pap.  Last seen 04/2020.  Daughter Huntley Dec is with her.   Her concerns today include:  Patient with history of HTN, HL, hypothyroidism, COVID pneumonia 02/2020, CKD 3   Abn LFTs:  LFTS was significantly abn on last visit.  Advised to hold the Lipitor.  Repeat LFTs still significantly abnormal.  CT of the abdomen revealed cirrhosis and portal venous hypertension without evidence of HCC.  Incidental finding of aortic atherosclerosis.  Viral hepatitis panel negative. -Referred to GI.  Several attempts made on my part to get her in as quickly as possible.  She declined initial appointment when they called her then no-showed the appointment that was scheduled for the end of May.  Patient states she was scared to go.  Her daughter was unaware of all of this. -Patient denies any history of EtOH abuse or street drug use.  HTN: compliant with Norvasc and HCTZ.  Did not take meds as yet for the morning Checking BP once a day.  Range 120-130/80  Hypothyroid: has empty bottle of Levothyroxine with her.  Out of med x 2 wks. Refill was sent 08/17/2020 but daughter states pharmacy said they were unable to get RF from my office. No feeling of being hot or cold  GYN History:  Pt is G7P2 (5 abortions) Any hx of abn paps?:no Menses regular or irregular?: postmenopausal How long does menses last? NA Menstrual flow light or heavy?:NA Method of birth control?: NA Any vaginal dischg at this time?: NO Dysuria?: no Any hx of STI?: no Sexually active with how many partners: currently not active Desires STI screen: no Last MMG: no Family hx of uterine, cervical or breast cancer?: No  HM:  due for colon cancer screening.  She declines getting COVID-19 vaccine series.  Due for mammogram. Patient Active Problem List   Diagnosis Date Noted   Influenza  vaccine refused 04/13/2020   Tetanus, diphtheria, and acellular pertussis (Tdap) vaccination declined 04/13/2020   COVID-19 vaccine dose declined 04/13/2020   Stage 3b chronic kidney disease (HCC) 03/09/2020   Acute respiratory failure due to COVID-19 (HCC) 02/11/2020   Essential hypertension 02/11/2020   Hypothyroidism 02/11/2020   HLD (hyperlipidemia) 02/11/2020     Current Outpatient Medications on File Prior to Visit  Medication Sig Dispense Refill   amLODipine (NORVASC) 5 MG tablet Take 1 tablet (5 mg total) by mouth daily. 30 tablet 6   hydrochlorothiazide (HYDRODIURIL) 12.5 MG tablet Take 1 tablet (12.5 mg total) by mouth daily. 90 tablet 3   albuterol (VENTOLIN HFA) 108 (90 Base) MCG/ACT inhaler INHALE 2 PUFFS INTO THE LUNGS EVERY SIX HOURS AS NEEDED FOR WHEEZING OR SHORTNESS OF BREATH. (Patient not taking: Reported on 08/31/2020) 18 g 0   No current facility-administered medications on file prior to visit.    No Known Allergies  Social History   Socioeconomic History   Marital status: Married    Spouse name: Not on file   Number of children: Not on file   Years of education: Not on file   Highest education level: Not on file  Occupational History   Not on file  Tobacco Use   Smoking status: Never   Smokeless tobacco: Never  Substance and Sexual Activity   Alcohol use: Not Currently   Drug use: Not Currently   Sexual activity:  Not on file  Other Topics Concern   Not on file  Social History Narrative   Not on file   Social Determinants of Health   Financial Resource Strain: Not on file  Food Insecurity: Not on file  Transportation Needs: Not on file  Physical Activity: Not on file  Stress: Not on file  Social Connections: Not on file  Intimate Partner Violence: Not on file    Family History  Problem Relation Age of Onset   Diabetes Mellitus II Neg Hx     No past surgical history on file.  ROS: Review of Systems  Constitutional:  Negative for  activity change.  HENT:  Negative for congestion.        Complains of problems hearing.  Her daughter noted that she does have to speak more loud when she talks to her.  Eyes:  Negative for visual disturbance.  Respiratory:  Negative for cough and shortness of breath.   Cardiovascular:  Negative for chest pain.  Gastrointestinal:  Negative for abdominal distention and blood in stool.  Musculoskeletal:        Complains of painful bunion on the right big toe.    PHYSICAL EXAM: BP (!) 152/84   Pulse (!) 56   Temp 97.9 F (36.6 C) (Oral)   Ht 5\' 3"  (1.6 m)   Wt 169 lb 12.8 oz (77 kg)   SpO2 96%   BMI 30.08 kg/m   Physical Exam  General appearance - alert, well appearing, middle-aged older Hispanic female and in no distress Mental status -flat affect.  She answers questions appropriately. Eyes - pupils equal and reactive, extraocular eye movements intact Ears - bilateral TM's and external ear canals normal Nose - normal and patent, no erythema, discharge or polyps Mouth - mucous membranes moist, pharynx normal without lesions.  Plaque buildup noted on several of her teeth.  She has 1 teeth where the filling is partially broken out in the left upper jaw Neck - supple, no significant adenopathy Lymphatics - no palpable lymphadenopathy, no hepatosplenomegaly Chest - clear to auscultation, no wheezes, rales or rhonchi, symmetric air entry Heart - normal rate, regular rhythm, normal S1, S2, no murmurs, rubs, clicks or gallops Abdomen - soft, nontender, nondistended, no masses or organomegaly Breasts - CMA April present for breast and pelvic exams: breasts appear normal, no suspicious masses, no skin or nipple changes or axillary nodes Pelvic - normal external genitalia, vulva, vagina, cervix, uterus and adnexa Musculoskeletal -noninflamed bunion of the right big toe. Extremities -no lower extremity edema.  Spider veins in the lower legs.   CMP Latest Ref Rng & Units 04/23/2020 04/15/2020  04/13/2020  Glucose 65 - 99 mg/dL - 86 92  BUN 8 - 27 mg/dL - 13 19  Creatinine 3.240.57 - 1.00 mg/dL - 4.01(U1.06(H) 2.72(Z1.13(H)  Sodium 134 - 144 mmol/L - 143 142  Potassium 3.5 - 5.2 mmol/L - 3.6 3.7  Chloride 96 - 106 mmol/L - 108(H) 108(H)  CO2 20 - 29 mmol/L - 18(L) 19(L)  Calcium 8.7 - 10.3 mg/dL - 9.3 9.8  Total Protein 6.0 - 8.5 g/dL 8.1 7.5 8.1  Total Bilirubin 0.0 - 1.2 mg/dL 3.0(H) 2.8(H) 3.2(H)  Alkaline Phos 44 - 121 IU/L 612(H) 651(H) 716(H)  AST 0 - 40 IU/L 1,156(HH) 865(HH) 1,188(HH)  ALT 0 - 32 IU/L 531(HH) 473(H) 589(HH)   Lipid Panel     Component Value Date/Time   CHOL 128 04/15/2020 0956   TRIG 150 (H) 04/15/2020 36640956  HDL 21 (L) 04/15/2020 0956   CHOLHDL 6.1 (H) 04/15/2020 0956   LDLCALC 80 04/15/2020 0956    CBC    Component Value Date/Time   WBC 7.2 04/15/2020 0956   WBC 11.9 (H) 02/16/2020 0341   RBC 4.19 04/15/2020 0956   RBC 3.60 (L) 02/16/2020 0341   HGB 12.9 04/15/2020 0956   HCT 37.9 04/15/2020 0956   PLT 221 04/15/2020 0956   MCV 91 04/15/2020 0956   MCH 30.8 04/15/2020 0956   MCH 31.4 02/16/2020 0341   MCHC 34.0 04/15/2020 0956   MCHC 33.3 02/16/2020 0341   RDW 13.7 04/15/2020 0956   LYMPHSABS 0.8 02/16/2020 0341   MONOABS 0.9 02/16/2020 0341   EOSABS 0.0 02/16/2020 0341   BASOSABS 0.0 02/16/2020 0341   Lab Results  Component Value Date   HEPAIGM Negative 04/15/2020   HEPBIGM Negative 04/15/2020  Screen for hepatitis C was negative.  ASSESSMENT AND PLAN: 1. Annual physical exam Discussed and encourage healthy eating habits and regular exercise.  Printed information given to patient on preventative care for her age group.  2. Pap smear for cervical cancer screening - Cytology - PAP  3. Encounter for screening mammogram for malignant neoplasm of breast -Patient is signed form for mammogram scholarship - MM Digital Screening; Future  4. Cirrhosis of liver without ascites, unspecified hepatic cirrhosis type [facility]) Now that daughter is  aware of what is happening, she has requested that we resubmit referral to the gastroenterologist.  She will accompany the patient to the appointment.  We will recheck liver profile today.  Daughter wanted to know if I had any idea of what may have caused the cirrhosis.  I told her that I did some baseline tests that came back okay and further evaluation has to be done by a specialist to determine cause. - Hepatic Function Panel - Ambulatory referral to Gastroenterology  5. Acquired hypothyroidism Refill given on levothyroxine.  We will plan to recheck TSH on subsequent visit. - levothyroxine (SYNTHROID) 100 MCG tablet; Take 1 tablet (100 mcg total) by mouth daily.  Dispense: 30 tablet; Refill: 6  6. Essential hypertension Not at goal today but she has not taken medicines as yet for the morning.  Reported home blood pressure readings have been good.  Continue current dose of HCTZ and amlodipine  7. Bunion of great toe of right foot -Advised to not wear tight fitting shoes - Ambulatory referral to Podiatry  8. Dental cavities Recommend referral to dentistry for routine dental care - Ambulatory referral to Dentistry  9. Aortic atherosclerosis (HCC) Discussed this in the context of increased cardiovascular risks.  Ideally she should be on statin therapy but we will hold off for now given abnormal LFTs with cirrhosis on imaging.  10. COVID-19 vaccine dose declined Strongly advised that she gets the COVID-19 vaccine series but patient declines  11. Obesity (BMI 30.0-34.9) Discussed importance of healthy eating habits and regular exercise.  Encouraged to get in about 30 minutes of moderate intensity exercise 5 days a week.  Advised to cut out sugary drinks from the diet, cut back on portion sizes of white carbohydrates, eat more lean white meat instead of red meat and incorporate fresh fruits and vegetables into the diet daily.  At the end of the visit patient wanted to discuss problems that  she is having sleeping.  I told her that due to time restraints, we will have to discuss on a subsequent visit.  I will have them schedule her  to come back in about 2 to 4 weeks and we will discuss it at that time.  Patient was given the opportunity to ask questions.  Patient verbalized understanding of the plan and was able to repeat key elements of the plan.  [employer] Language interpreter used during this encounter. #[id number], Areli  Orders Placed This Encounter  Procedures   MM Digital Screening   Hepatic Function Panel   Ambulatory referral to Gastroenterology   Ambulatory referral to Podiatry   Ambulatory referral to Dentistry      Requested Prescriptions   Signed Prescriptions Disp Refills   levothyroxine (SYNTHROID) 100 MCG tablet 30 tablet 6    Sig: Take 1 tablet (100 mcg total) by mouth daily.    Return in about 3 weeks (around 09/21/2020) for Discuss insomnia.  Jonah Blue, MD, FACP

## 2020-08-31 NOTE — Patient Instructions (Signed)
Cuidados preventivos en las mujeres de 63 a 48 aos de edad Preventive Care 34-63 Years Old, Female Los cuidados preventivos hacen referencia a las opciones en cuanto al estilo de vida y a las visitas al mdico, las cuales pueden promover la salud y Musician. Esto puede comprender lo siguiente: Un examen fsico anual. Esto tambin se conoce como visita de control de bienestar anual. Exmenes dentales y oculares de [place] regular. Vacunas. Estudios para Health and safety inspector. Elecciones para un estilo de vida saludable, por ejemplo: Seguir una dieta saludable. Practicar actividad fsica con regularidad. No consumir drogas ni productos que contengan nicotina y tabaco. Limitar el consumo de bebidas alcohlicas. Qu puedo esperar para mi visita de cuidado preventivo? Examen fsico El mdico revisar lo siguiente: IT consultant y [place]. Estos pueden usarse para calcular el IMC (ndice de masa corporal). El [facility] es una medicin que indica si tiene un peso saludable. Frecuencia cardaca y presin arterial. Temperatura corporal. Piel para detectar manchas anormales. Asesoramiento Su mdico puede preguntarle acerca de: Problemas mdicos pasados. Antecedentes mdicos familiares. Consumo de tabaco, alcohol y drogas. Su bienestar emocional. Restaurant manager, fast food y las relaciones personales. Su actividad sexual. Hbitos de alimentacin, ejercicio y sueo. Su trabajo y [place] laboral. Acceso a armas de fuego. Mtodos anticonceptivos. Ciclo menstrual. Antecedentes de embarazo. Qu vacunas necesito?  Las vacunas se aplican a varias edades, segn un calendario. El [employer] recomendar vacunas segn su edad, sus antecedentes mdicos, su estilo de viday otros factores, como los viajes o el lugar donde trabaja. Qu pruebas necesito? Anlisis de [place] de lpidos y colesterol. Estos se pueden verificar cada 5 aos, o ms a menudo, si usted tiene ms de 32 aos de edad. Anlisis de  hepatitis C. Anlisis de hepatitis B. Pruebas de deteccin Pruebas de deteccin de cncer de pulmn. Es posible que se le realice esta prueba de deteccin a partir de los 63 aos de edad, si ha fumado durante 30 aos un paquete diario y sigue fumando o dej el hbito en algn momento en los ltimos 15 aos. Pruebas de Programme researcher, broadcasting/film/video de Surveyor, minerals. Todos los adultos a partir de los 37 aos de edad y [place] 63 aos de edad deben hacerse esta prueba de deteccin. El mdico puede recomendarle las pruebas de deteccin a partir de los 63 aos de edad si corre un mayor riesgo. Le realizarn pruebas cada 1 a 10 aos, segn los [place] y el tipo de prueba de Programme researcher, broadcasting/film/video. Pruebas de deteccin de la diabetes. Esto se Set designer un control del azcar en la sangre (glucosa) despus de no haber comido durante un periodo de tiempo (ayuno). Es posible que se le realice esta prueba cada 1 a 3 aos. Mamografa. Se puede realizar cada 1 o 2 aos. Hable con su mdico sobre cundo debe comenzar a Engineer, manufacturing de [place] regular. Esto depende de si tiene antecedentes familiares de cncer de mama o no. Pruebas de deteccin de cncer relacionado con las mutaciones del BRCA. Es posible que se las deba realizar si tiene antecedentes de cncer de mama, de ovario, de trompas o peritoneal. Examen plvico y prueba de Papanicolaou. Esto se puede Optometrist cada 3 aos a partir de los 21 aos de [place]. A partir de los 30 aos, esto se puede Optometrist cada 5 aos si usted se realiza una prueba de Papanicolaou en combinacin con una prueba de deteccin del virus del papiloma humano (VPH). Otras pruebas Pruebas de enfermedades de transmisin sexual (ETS), si est en  riesgo. Densitometra sea. Esto se realiza para detectar osteoporosis. Se le puede realizar este examen de deteccin si tiene un riesgo alto de tener osteoporosis. Hable con su mdico [employer], las opciones detratamiento  y, si corresponde, la necesidad de Optometrist ms pruebas. Siga estas instrucciones en su casa: Comida y bebida  Siga una dieta que incluya frutas y verduras frescas, cereales integrales, protenas magras y productos lcteos descremados. Tome los suplementos vitamnicos y [employer] se lo haya indicado el mdico. No beba alcohol si: Su mdico le indica no hacerlo. Est embarazada, puede estar embarazada o est tratando de [place]. Si bebe alcohol: Limite la cantidad que consume de 0 a 1 medida por da. Est atenta a la cantidad de alcohol que hay en las bebidas que toma. En los [place], una medida equivale a una botella de cerveza de 12 oz (355 ml), un vaso de vino de 5 oz (148 ml) o un vaso de una bebida alcohlica de alta graduacin de 1 oz (44 ml).  Estilo de [employer] y las encas a diario. Cepllese los dientes a la maana y a la noche con pasta dental con fluoruro. Use hilo dental una vez al da. Mantngase activa. Haga al menos 30 minutos de ejercicio, 5 o ms das cada semana. No consuma ningn producto que contenga nicotina o tabaco, como cigarrillos, cigarrillos electrnicos y tabaco de Higher education careers adviser. Si necesita ayuda para dejar de fumar, consulte al mdico. No consuma drogas. Si es sexualmente activa, practique sexo seguro. Use un condn u otra forma de proteccin para prevenir las ITS (infecciones de transmisin sexual). Si no desea quedar embarazada, use un mtodo anticonceptivo. Si busca un embarazo, realice una consulta previa al [employer] con el mdico. Si el mdico se lo indic, tome una dosis baja de aspirina diariamente a partir de los 42 aos de [place]. Encuentre formas saludables de lidiar con el estrs tales como: Meditacin, yoga o Conservation officer, nature. Lleve un diario personal. Hable con una persona confiable. Pase tiempo con amigos y familiares. Seguridad [place] siempre el cinturn de seguridad al conducir o viajar en un vehculo. No  conduzca: Si ha estado bebiendo alcohol. No viaje con un conductor que ha estado bebiendo. Si est cansada o distrada. Mientras est enviando mensajes de texto. Use un casco y otros equipos de proteccin [place] deportivas. Si tiene armas de fuego en su casa, asegrese de seguir todos los procedimientos de seguridad correspondientes. Cundo volver? Visite al mdico una vez al ao para una visita anual de control de bienestar. Pregntele al mdico con qu frecuencia debe realizarse un control de la vista y los dientes. Mantenga su esquema de vacunacin al da. Esta informacin no tiene Marine scientist el consejo del mdico. Asegresede hacerle al mdico cualquier pregunta que tenga. Document Revised: 11/05/2019 Document Reviewed: 11/05/2019 Elsevier Patient Education  2022 [employer].

## 2020-09-01 ENCOUNTER — Telehealth: Payer: Self-pay | Admitting: Internal Medicine

## 2020-09-01 LAB — HEPATIC FUNCTION PANEL
ALT: 20 IU/L (ref 0–32)
AST: 21 IU/L (ref 0–40)
Albumin: 4.6 g/dL (ref 3.8–4.8)
Alkaline Phosphatase: 115 IU/L (ref 44–121)
Bilirubin Total: 0.8 mg/dL (ref 0.0–1.2)
Bilirubin, Direct: 0.12 mg/dL (ref 0.00–0.40)
Total Protein: 7 g/dL (ref 6.0–8.5)

## 2020-09-01 NOTE — Telephone Encounter (Signed)
Phone call placed to patient this morning.  I was able to speak with her daughter Huntley Dec who was with her yesterday.  I informed her that the patient's blood test for the liver function came back normal.  This is good.  However, I still would like for the patient to see the gastroenterologist as the CT scan done earlier this year during the time that her liver function tests were abnormal showed findings of cirrhosis.  In terms of what may have caused the elevation in the liver function test to begin with, some ideas that I have are post COVID liver injury or liver injury due to the medication use to help treat the COVID when she was hospitalized in combination with the atorvastatin.  Atorvastatin has since been discontinued by me.

## 2020-09-02 LAB — CYTOLOGY - PAP
Adequacy: ABSENT
Comment: NEGATIVE
Diagnosis: NEGATIVE
High risk HPV: NEGATIVE

## 2020-09-14 ENCOUNTER — Other Ambulatory Visit: Payer: Self-pay

## 2020-09-14 ENCOUNTER — Encounter: Payer: Self-pay | Admitting: Sports Medicine

## 2020-09-14 ENCOUNTER — Ambulatory Visit (INDEPENDENT_AMBULATORY_CARE_PROVIDER_SITE_OTHER): Payer: 59

## 2020-09-14 ENCOUNTER — Ambulatory Visit (INDEPENDENT_AMBULATORY_CARE_PROVIDER_SITE_OTHER): Payer: 59 | Admitting: Sports Medicine

## 2020-09-14 DIAGNOSIS — M79672 Pain in left foot: Secondary | ICD-10-CM | POA: Diagnosis not present

## 2020-09-14 DIAGNOSIS — M21612 Bunion of left foot: Secondary | ICD-10-CM | POA: Diagnosis not present

## 2020-09-14 DIAGNOSIS — M21611 Bunion of right foot: Secondary | ICD-10-CM

## 2020-09-14 DIAGNOSIS — M79671 Pain in right foot: Secondary | ICD-10-CM

## 2020-09-14 MED ORDER — DICLOFENAC SODIUM 75 MG PO TBEC: 75.0000 mg | DELAYED_RELEASE_TABLET | Freq: Two times a day (BID) | ORAL | 0 refills | Status: DC

## 2020-09-14 NOTE — Progress Notes (Addendum)
Subjective: Amanda Blanchard is a 65 y.o. female patient who presents to office for evaluation of Right> Left bunion pain. Patient complains of progressive pain especially over the last year in the Right>Left foot that starts as pain over the bump with direct pressure and range of motion; patient now has difficulty fitting shoes comfortably. Ranks pain 3/10 and is now interferring with daily activities.  Patient has not tried any treatment.  Patient denies any other pedal complaints.   Patient is assisted by interpreter this visit.  Patient Active Problem List   Diagnosis Date Noted   Aortic atherosclerosis (HCC) 08/31/2020   Cirrhosis of liver without ascites (HCC) 08/31/2020   Bunion of great toe of right foot 08/31/2020   Obesity (BMI 30.0-34.9) 08/31/2020   Influenza vaccine refused 04/13/2020   Tetanus, diphtheria, and acellular pertussis (Tdap) vaccination declined 04/13/2020   COVID-19 vaccine dose declined 04/13/2020   Stage 3b chronic kidney disease (HCC) 03/09/2020   Acute respiratory failure due to COVID-19 (HCC) 02/11/2020   Essential hypertension 02/11/2020   Hypothyroidism 02/11/2020   HLD (hyperlipidemia) 02/11/2020    Current Outpatient Medications on File Prior to Visit  Medication Sig Dispense Refill   albuterol (VENTOLIN HFA) 108 (90 Base) MCG/ACT inhaler INHALE 2 PUFFS INTO THE LUNGS EVERY SIX HOURS AS NEEDED FOR WHEEZING OR SHORTNESS OF BREATH. (Patient not taking: Reported on 08/31/2020) 18 g 0   amLODipine (NORVASC) 5 MG tablet Take 1 tablet (5 mg total) by mouth daily. 30 tablet 6   hydrochlorothiazide (HYDRODIURIL) 12.5 MG tablet Take 1 tablet (12.5 mg total) by mouth daily. 90 tablet 3   levothyroxine (SYNTHROID) 100 MCG tablet Take 1 tablet (100 mcg total) by mouth daily. 30 tablet 6   No current facility-administered medications on file prior to visit.    No Known Allergies  Objective:  General: Alert and oriented x3 in no acute  distress  Dermatology: No open lesions bilateral lower extremities, no webspace macerations, no ecchymosis bilateral, all nails x 10 are well manicured.  Vascular: Dorsalis Pedis and Posterior Tibial pedal pulses 2/4, Capillary Fill Time 3 seconds, (+) pedal hair growth bilateral, no edema bilateral lower extremities, Temperature gradient within normal limits.  Neurology: Gross sensation intact via light touch bilateral.  Musculoskeletal: Mild tenderness with palpation right>left bunion deformity, no limitation or crepitus with range of motion, deformity reducible, tracking not trackbound, there is mild 1st ray hypermobility noted bilateral. Midtarsal, Subtalar joint, and ankle joint range of motion is within normal limits. On weightbearing exam, there is decreased 1st MTPJ rom Right>Left with functional limitus noted, 30 degrees dorsiflexion on the right 45 degrees on the left 10 degrees plantarflexion bilateral at first MPJs, there is medial arch collapse Right> Left on weightbearing, rearfoot slight valgus, forefoot slight abduction with HAV deformity supported on ground with no second toe crossover deformity noted.   Gait: Non-Antalgic gait with increased medial arch collapse and pronatory influence noted on Right> Left foot with medial 1st MTPJ roll-off at toe-off, heel off within normal limits.   Xrays  Right/Left Foot    Impression: Intermetatarsal angle above normal limits supportive of bunion with hallux pronation and overlapping noted.      Assessment and Plan: Problem List Items Addressed This Visit   None Visit Diagnoses     Bilateral bunions    -  Primary   Relevant Orders   DG Foot Complete Right   DG Foot Complete Left   Foot pain, bilateral            -  Complete examination performed -Xrays reviewed -Discussed treatement options; discussed HAV deformity;conservative and  Surgical management; risks, benefits, alternatives discussed. All patient's questions answered. -Rx  diclofenac to take as instructed -Dispensed bilateral bunion shields to use as directed -Recommend continue with good supportive shoes and inserts.  -Patient to return to office in 6 weeks or sooner if condition worsens.  Advised patient if symptoms fail to continue to improve would benefit from bunion surgery Lapidus procedure.  Asencion Islam, DPM

## 2020-09-27 ENCOUNTER — Other Ambulatory Visit: Payer: Self-pay

## 2020-09-27 ENCOUNTER — Ambulatory Visit (HOSPITAL_BASED_OUTPATIENT_CLINIC_OR_DEPARTMENT_OTHER): Payer: Self-pay | Admitting: Internal Medicine

## 2020-09-27 DIAGNOSIS — Z5329 Procedure and treatment not carried out because of patient's decision for other reasons: Secondary | ICD-10-CM

## 2020-09-27 NOTE — Progress Notes (Signed)
Pt no showed for this telephone appt I was able to reach her daughter Huntley Dec who was at work.  She told me I should be able to reach pt at home.  Call placed to home number using [facility] [phone] at 9:54 a.m VMB not set up and we were not able to leave a message

## 2020-10-25 ENCOUNTER — Ambulatory Visit: Payer: Self-pay

## 2020-10-26 ENCOUNTER — Encounter: Payer: Self-pay | Admitting: Sports Medicine

## 2020-10-26 ENCOUNTER — Ambulatory Visit: Payer: 59 | Admitting: Sports Medicine

## 2020-10-26 ENCOUNTER — Other Ambulatory Visit: Payer: Self-pay

## 2020-10-26 DIAGNOSIS — M79671 Pain in right foot: Secondary | ICD-10-CM

## 2020-10-26 DIAGNOSIS — M21611 Bunion of right foot: Secondary | ICD-10-CM

## 2020-10-26 DIAGNOSIS — M21612 Bunion of left foot: Secondary | ICD-10-CM | POA: Diagnosis not present

## 2020-10-26 DIAGNOSIS — M79672 Pain in left foot: Secondary | ICD-10-CM | POA: Diagnosis not present

## 2020-10-26 MED ORDER — DICLOFENAC SODIUM 1 % EX GEL: 4.0000 g | Freq: Four times a day (QID) | CUTANEOUS | 1 refills | Status: DC

## 2020-10-26 MED ORDER — DICLOFENAC SODIUM 75 MG PO TBEC: 75.0000 mg | DELAYED_RELEASE_TABLET | Freq: Two times a day (BID) | ORAL | 0 refills | Status: DC

## 2020-10-26 NOTE — Progress Notes (Signed)
Subjective: Amanda Blanchard is a 65 y.o. female patient who returns to office for evaluation of Right> Left bunion pain. Patient reports pain is 3/10 R>L. Medication and cushions help but still has some pain when removes them.  Patient denies any other pedal complaints.   Patient is assisted by interpreter this visit.  Patient Active Problem List   Diagnosis Date Noted   Aortic atherosclerosis (HCC) 08/31/2020   Cirrhosis of liver without ascites (HCC) 08/31/2020   Bunion of great toe of right foot 08/31/2020   Obesity (BMI 30.0-34.9) 08/31/2020   Influenza vaccine refused 04/13/2020   Tetanus, diphtheria, and acellular pertussis (Tdap) vaccination declined 04/13/2020   COVID-19 vaccine dose declined 04/13/2020   Stage 3b chronic kidney disease (HCC) 03/09/2020   Acute respiratory failure due to COVID-19 (HCC) 02/11/2020   Essential hypertension 02/11/2020   Hypothyroidism 02/11/2020   HLD (hyperlipidemia) 02/11/2020    Current Outpatient Medications on File Prior to Visit  Medication Sig Dispense Refill   albuterol (VENTOLIN HFA) 108 (90 Base) MCG/ACT inhaler INHALE 2 PUFFS INTO THE LUNGS EVERY SIX HOURS AS NEEDED FOR WHEEZING OR SHORTNESS OF BREATH. (Patient not taking: Reported on 08/31/2020) 18 g 0   amLODipine (NORVASC) 5 MG tablet Take 1 tablet (5 mg total) by mouth daily. 30 tablet 6   diclofenac (VOLTAREN) 75 MG EC tablet Take 1 tablet (75 mg total) by mouth 2 (two) times daily. 30 tablet 0   hydrochlorothiazide (HYDRODIURIL) 12.5 MG tablet Take 1 tablet (12.5 mg total) by mouth daily. 90 tablet 3   levothyroxine (SYNTHROID) 100 MCG tablet Take 1 tablet (100 mcg total) by mouth daily. 30 tablet 6   No current facility-administered medications on file prior to visit.    No Known Allergies  Objective:  General: Alert and oriented x3 in no acute distress  Dermatology: No open lesions bilateral lower extremities, no webspace macerations, no ecchymosis bilateral, all nails  x 10 are well manicured.  Vascular: Dorsalis Pedis and Posterior Tibial pedal pulses 2/4, Capillary Fill Time 3 seconds, (+) pedal hair growth bilateral, no edema bilateral lower extremities, Temperature gradient within normal limits.  Neurology: Michaell Cowing sensation intact via light touch bilateral.  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  Pes planus foot type.       Assessment and Plan: Problem List Items Addressed This Visit   None Visit Diagnoses     Bilateral bunions    -  Primary   Foot pain, bilateral            -Complete examination performed -re-Discussed treatement options; discussed HAV deformity;conservative and  Surgical management; risks, benefits, alternatives discussed. All patient's questions answered. -Advised patient at this time since her pain level is low we will continue to manage her conservatively -Refilled diclofenac to take as instructed -Prescribed topical Voltaren gel for patient to use twice daily to bunion -Continue with bilateral bunion shields to use as directed -Recommend continue with good supportive shoes and inserts like previous.  -Patient to return to office in 12 weeks or sooner if condition worsens.  Advised patient if symptoms fail to continue to improve would benefit from surgery however at this time since pain level is low we will continue with conservative care. Asencion Islam, DPM

## 2020-11-23 ENCOUNTER — Other Ambulatory Visit: Payer: Self-pay

## 2020-11-23 ENCOUNTER — Other Ambulatory Visit: Payer: Self-pay | Admitting: Internal Medicine

## 2020-11-23 ENCOUNTER — Ambulatory Visit: Payer: Self-pay | Attending: Internal Medicine | Admitting: Internal Medicine

## 2020-11-23 ENCOUNTER — Encounter: Payer: Self-pay | Admitting: Internal Medicine

## 2020-11-23 ENCOUNTER — Ambulatory Visit (HOSPITAL_COMMUNITY)
Admission: RE | Admit: 2020-11-23 | Discharge: 2020-11-23 | Disposition: A | Discharge status: Home / Self Care | Payer: Self-pay | Source: Ambulatory Visit | Attending: Internal Medicine | Admitting: Internal Medicine

## 2020-11-23 VITALS — BP 118/66 | HR 76 | Resp 16 | Wt 172.8 lb

## 2020-11-23 DIAGNOSIS — Z1211 Encounter for screening for malignant neoplasm of colon: Secondary | ICD-10-CM

## 2020-11-23 DIAGNOSIS — I10 Essential (primary) hypertension: Secondary | ICD-10-CM

## 2020-11-23 DIAGNOSIS — G8929 Other chronic pain: Secondary | ICD-10-CM | POA: Insufficient documentation

## 2020-11-23 DIAGNOSIS — Z6830 Body mass index (BMI) 30.0-30.9, adult: Secondary | ICD-10-CM

## 2020-11-23 DIAGNOSIS — Z23 Encounter for immunization: Secondary | ICD-10-CM

## 2020-11-23 DIAGNOSIS — M25552 Pain in left hip: Secondary | ICD-10-CM | POA: Insufficient documentation

## 2020-11-23 DIAGNOSIS — Z1231 Encounter for screening mammogram for malignant neoplasm of breast: Secondary | ICD-10-CM

## 2020-11-23 DIAGNOSIS — Z1331 Encounter for screening for depression: Secondary | ICD-10-CM

## 2020-11-23 DIAGNOSIS — E669 Obesity, unspecified: Secondary | ICD-10-CM

## 2020-11-23 DIAGNOSIS — E039 Hypothyroidism, unspecified: Secondary | ICD-10-CM

## 2020-11-23 MED ORDER — MELOXICAM 15 MG PO TABS: 15.0000 mg | ORAL_TABLET | Freq: Every day | ORAL | 3 refills | Status: DC

## 2020-11-23 MED ORDER — AMLODIPINE BESYLATE 5 MG PO TABS
5.0000 mg | ORAL_TABLET | Freq: Every day | ORAL | 6 refills | Status: DC
Start: 2020-11-23 — End: 2021-06-16

## 2020-11-23 NOTE — Progress Notes (Signed)
Patient ID: Amanda Blanchard, female    DOB: [birth year]  MRN: [id number]  CC: Hypertension   Subjective: Amanda Blanchard is a 65 y.o. female who presents for chronic ds management Her concerns today include:  Patient with history of HTN, HL, hypothyroidism, COVID pneumonia 02/2020, CKD 3, markedly abnormal LFTs 03/2020 with cirrhotic changes on imaging.  LFTs have subsequently normalized.  Pt c/o pain in LT hip into groin x 4-5 mths.  Does not recall any initiating factors. Pain constant, some days it hurts to even stand up Nothing makes it worse.  No falls Taking Tylenol 2 tabs up to 4 times a day. Last took Tylenol yesterday.  On Diclofenac from Podiatry.  Was not taking every day.  As a matter of fact she stopped taking because it was causing her feet to swell Pt obese for height.  Not getting in any exercise because of the pain in the hip.  Feels she does well with eating habits.  Positive depression screen:  endorses feeling depressed sometimes but states this is not a mjor issue for her.  "I feel good."  Denies SI ideation. She does not identify any contributing factors.  Does not feel she needs counseling or med  HTN: compliant with meds  Checks BP at home every 3 days.  Does not have log with her.  Reports readings have been good but she does not recall any readings to give me Limits salt in foods  Thyroid:  taking and tolerating Levothyroxine daily  On last visit we had repeated her LFTs.  They had completely normalized.  HM: due for flu and pneumonia vaccines. Referred for MMG on last visit.  Does not recall being called.  She has Friday insurance Patient Active Problem List   Diagnosis Date Noted   Aortic atherosclerosis (HCC) 08/31/2020   Cirrhosis of liver without ascites (HCC) 08/31/2020   Bunion of great toe of right foot 08/31/2020   Obesity (BMI 30.0-34.9) 08/31/2020   Influenza vaccine refused 04/13/2020   Tetanus, diphtheria, and acellular pertussis  (Tdap) vaccination declined 04/13/2020   COVID-19 vaccine dose declined 04/13/2020   Stage 3b chronic kidney disease (HCC) 03/09/2020   Acute respiratory failure due to COVID-19 (HCC) 02/11/2020   Essential hypertension 02/11/2020   Hypothyroidism 02/11/2020   HLD (hyperlipidemia) 02/11/2020     Current Outpatient Medications on File Prior to Visit  Medication Sig Dispense Refill   diclofenac Sodium (VOLTAREN) 1 % GEL Apply 4 g topically 4 (four) times daily. 150 g 1   hydrochlorothiazide (HYDRODIURIL) 12.5 MG tablet Take 1 tablet (12.5 mg total) by mouth daily. 90 tablet 3   levothyroxine (SYNTHROID) 100 MCG tablet Take 1 tablet (100 mcg total) by mouth daily. 30 tablet 6   No current facility-administered medications on file prior to visit.    No Known Allergies  Social History   Socioeconomic History   Marital status: Married    Spouse name: Not on file   Number of children: Not on file   Years of education: Not on file   Highest education level: Not on file  Occupational History   Not on file  Tobacco Use   Smoking status: Never   Smokeless tobacco: Never  Substance and Sexual Activity   Alcohol use: Not Currently   Drug use: Not Currently   Sexual activity: Not on file  Other Topics Concern   Not on file  Social History Narrative   Not on file   Social Determinants  of Health   Financial Resource Strain: Not on file  Food Insecurity: Not on file  Transportation Needs: Not on file  Physical Activity: Not on file  Stress: Not on file  Social Connections: Not on file  Intimate Partner Violence: Not on file    Family History  Problem Relation Age of Onset   Diabetes Mellitus II Neg Hx     History reviewed. No pertinent surgical history.  ROS: Review of Systems Negative except as stated above  PHYSICAL EXAM: BP 118/66   Pulse 76   Resp 16   Wt 172 lb 12.8 oz (78.4 kg)   SpO2 96%   BMI 30.61 kg/m   Wt Readings from Last 3 Encounters:  11/23/20  172 lb 12.8 oz (78.4 kg)  08/31/20 169 lb 12.8 oz (77 kg)  04/13/20 168 lb 6.4 oz (76.4 kg)    Physical Exam  General appearance - alert, well appearing, and in no distress Mental status - normal mood, behavior, speech, dress, motor activity, and thought processes Neck - supple, no significant adenopathy Chest - clear to auscultation, no wheezes, rales or rhonchi, symmetric air entry Heart - normal rate, regular rhythm, normal S1, S2, no murmurs, rubs, clicks or gallops Musculoskeletal - LT hip: No tenderness on palpation over the left trochanteric bursa.  She has moderate discomfort with passive flexion and rotation of the left hip.  No hernia noted in the left inguinal area. Extremities - peripheral pulses normal, no pedal edema, no clubbing or cyanosis   CMP Latest Ref Rng & Units 08/31/2020 04/23/2020 04/15/2020  Glucose 65 - 99 mg/dL - - 86  BUN 8 - 27 mg/dL - - 13  Creatinine 2.35 - 1.00 mg/dL - - 5.73(U)  Sodium 202 - 144 mmol/L - - 143  Potassium 3.5 - 5.2 mmol/L - - 3.6  Chloride 96 - 106 mmol/L - - 108(H)  CO2 20 - 29 mmol/L - - 18(L)  Calcium 8.7 - 10.3 mg/dL - - 9.3  Total Protein 6.0 - 8.5 g/dL 7.0 8.1 7.5  Total Bilirubin 0.0 - 1.2 mg/dL 0.8 3.0(H) 2.8(H)  Alkaline Phos 44 - 121 IU/L 115 612(H) 651(H)  AST 0 - 40 IU/L 21 1,156(HH) 865(HH)  ALT 0 - 32 IU/L 20 531(HH) 473(H)   Lipid Panel     Component Value Date/Time   CHOL 128 04/15/2020 0956   TRIG 150 (H) 04/15/2020 0956   HDL 21 (L) 04/15/2020 0956   CHOLHDL 6.1 (H) 04/15/2020 0956   LDLCALC 80 04/15/2020 0956    CBC    Component Value Date/Time   WBC 7.2 04/15/2020 0956   WBC 11.9 (H) 02/16/2020 0341   RBC 4.19 04/15/2020 0956   RBC 3.60 (L) 02/16/2020 0341   HGB 12.9 04/15/2020 0956   HCT 37.9 04/15/2020 0956   PLT 221 04/15/2020 0956   MCV 91 04/15/2020 0956   MCH 30.8 04/15/2020 0956   MCH 31.4 02/16/2020 0341   MCHC 34.0 04/15/2020 0956   MCHC 33.3 02/16/2020 0341   RDW 13.7 04/15/2020 0956    LYMPHSABS 0.8 02/16/2020 0341   MONOABS 0.9 02/16/2020 0341   EOSABS 0.0 02/16/2020 0341   BASOSABS 0.0 02/16/2020 0341    ASSESSMENT AND PLAN: 1. 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 We will get imaging in the form of an x-ray.  Discussed the importance of getting her weight down to help take mechanical strain off the joint.  We will put  her on meloxicam in the meantime. - meloxicam (MOBIC) 15 MG tablet; Take 1 tablet (15 mg total) by mouth daily.  Dispense: 30 tablet; Refill: 3  2. Essential hypertension At goal.  Continue current medications and low-salt diet. - amLODipine (NORVASC) 5 MG tablet; Take 1 tablet (5 mg total) by mouth daily.  Dispense: 30 tablet; Refill: 6 - Comprehensive metabolic panel  3. Acquired hypothyroidism Continue levothyroxine.  She is due for TSH check today. - TSH  4. Obesity (BMI 30.0-34.9) Discussed and encourage healthy eating habits.  Printed information also given.  5. Positive depression screening Patient reports this is not a major issue for her and declines referral to a therapist.  She does not feel she needs to be on any medication at this time.  6. Screening for colon cancer Agreeable to colon cancer screening with fit test. - Fecal occult blood, imunochemical([facility]/Sunquest)  7. Need for immunization against influenza  - Flu Vaccine QUAD 59mo+IM (Fluarix, Fluzone & Alfiuria Quad PF)  8. Need for vaccination against Streptococcus pneumoniae - Pneumococcal conjugate vaccine 20-valent  9. Encounter for screening mammogram for malignant neoplasm of breast - MM Digital Screening; Future      Patient was given the opportunity to ask questions.  Patient verbalized understanding of the plan and was able to repeat key elements of the plan.  [employer] Language interpreter used during this encounter. [id number], Elita Quick  Orders Placed This Encounter  Procedures   Fecal occult blood,  imunochemical([facility]/Sunquest)   MM Digital Screening   DG Hip Unilat W OR W/O Pelvis Min 4 Views Left   Flu Vaccine QUAD 58mo+IM (Fluarix, Fluzone & Alfiuria Quad PF)   Pneumococcal conjugate vaccine 20-valent   TSH   Comprehensive metabolic panel     Requested Prescriptions   Signed Prescriptions Disp Refills   amLODipine (NORVASC) 5 MG tablet 30 tablet 6    Sig: Take 1 tablet (5 mg total) by mouth daily.   meloxicam (MOBIC) 15 MG tablet 30 tablet 3    Sig: Take 1 tablet (15 mg total) by mouth daily.    Return in about 4 months (around 03/23/2021).  Karle Plumber, MD, FACP

## 2020-11-23 NOTE — Patient Instructions (Addendum)
Please go to the radiology department at [facility] to get the x-ray done of your left hip.  We have started a medication called meloxicam for you to take once a day for the pain in the hip.  You should decrease the amount of Tylenol that you take to no more than 1 tablet 3 times a day as needed.  Alimentacin saludable Healthy Eating Seguir una modalidad de alimentacin saludable puede ayudarlo a Barista y Pharmacologist un peso saludable, reducir el riesgo de tener enfermedades crnicas y vivir [place]omia Dear vida larga y productiva. Es importante que siga una modalidad de alimentacin saludable con un nivel adecuado de caloras para su cuerpo. Debe cubrir sus necesidades nutricionales principalmente a travs de los alimentos, escogiendo una variedad de alimentos ricos en nutrientes. Cules son algunos consejos para seguir este plan? Lea las etiquetas de los alimentos Lea las etiquetas y elija las que digan lo siguiente: Reducido en sodio o con bajo contenido de [place]. Jugos con 100 % jugo de fruta. Alimentos con bajo contenido de grasas saturadas y alto contenido de grasas poliinsaturadas y Mining engineer. Alimentos con cereales integrales, como trigo integral, trigo partido, arroz integral y arroz salvaje. Cereales integrales fortificados con cido flico. Se recomienda a las mujeres embarazadas o que desean quedar embarazadas. Lea las etiquetas y evite: Los alimentos con una gran cantidad de Biochemist, clinical. Estos incluyen los alimentos que contienen azcar moreno, endulzante a base de maz, jarabe de maz, dextrosa, fructosa, glucosa, jarabe de maz de alta fructosa, miel, azcar invertido, lactosa, jarabe de [place], maltosa, [place], azcar sin refinar, sacarosa, trehalosa y azcar turbinado. No consuma ms que las siguientes cantidades de azcar agregada por da: 6 cucharaditas (25 g) las mujeres. 9 cucharaditas (38 g) los hombres. Los alimentos que contienen almidones y cereales refinados o  procesados. Los productos de cereales refinados, como harina blanca, harina de maz desgerminada, pan blanco y arroz blanco. Al ir de compras Elija refrigerios ricos en nutrientes, como verduras, frutas enteras y frutos secos. Evite los refrigerios con alto contenido de caloras y International aid/development worker, como las papas fritas, los refrigerios frutales y los caramelos. Use alios y productos para untar a base de aceite con los Publishing rights manager de grasas slidas como la [place], la margarina en barra o el queso crema. Limite las salsas, las mezclas y los productos "instantneos" preelaborados como el arroz saborizado, los fideos instantneos y las pastas listas para comer. Pruebe ms fuentes de protena vegetal, como tofu, tempeh, frijoles negros, edamame, lentejas, frutos secos y semillas. Explore planes de alimentacin como la dieta mediterrnea o la dieta vegetariana. Al cocinar Use aceite para Designer, multimedia de grasas slidas como [place], margarina en barra o [place] de cerdo. En lugar de frer, trate de cocinar en el horno, en la plancha o en la parrilla, o hervir los alimentos. Retire la parte grasa de las carnes antes de cocinarlas. Cocine las verduras al vapor en agua o caldo. Planificacin de las comidas  En las comidas, imagine dividir su plato en cuartos: La mitad del plato tiene frutas y verduras. Un cuarto del plato tiene cereales integrales. Un cuarto del plato tiene protena, especialmente carnes [place], aves, huevos, tofu, frijoles o frutos secos. Incluya lcteos descremados en su dieta diaria. Estilo de vida Elija opciones saludables en todos los mbitos, como en el hogar, el [place], la [place], los restaurantes y [place]. Prepare los alimentos de un modo seguro: Lvese las manos despus de manipular carnes crudas. Mantenga las superficies  de preparacin de los alimentos limpias lavndolas regularmente con agua caliente y [place]. Mantenga las carnes crudas  separadas de los alimentos que estn listos para comer como las frutas y las verduras. Cocine los frutos de mar, carnes, aves y Production manager la temperatura interna recomendada. Almacene los alimentos a temperaturas seguras. En general: Mantenga los alimentos fros a una temperatura de 40 F (4,4 C) o inferior. Mantenga los alimentos calientes a una temperatura de 140 F (60 C) o superior. Mantenga el congelador a una temperatura de 0 F (-17,8 C) o inferior. Los alimentos dejan de ser seguros para su consumo cuando han estado a una temperatura de entre 40 y 140 F (4,4 y 60 C) por ms de 2 horas. Qu alimentos debo consumir? Frutas Propngase comer el equivalente a 2 tazas de frutas frescas, enlatadas (en su jugo natural) o Primary school teacher. Algunos ejemplos de equivalentes a 1 taza de frutas son 1 manzana pequea, 8 fresas grandes, 1 taza de fruta enlatada,  taza de fruta desecada o 1 taza de jugo 100 %. Verduras Propngase comer el equivalente a 2 o 3 tazas de verduras frescas y Primary school teacher, incluyendo diferentes variedades y colores. Algunos ejemplos de equivalentes a 1 taza de verduras son 2 zanahorias medianas, 2 tazas de verduras de [employer] crudas, 1 taza de verduras cortadas (crudas o cocidas) o 1 papa mediana al horno. Granos Propngase comer el equivalente a 6 onzas de cereales integrales por da. Algunos ejemplos de equivalentes a 1 onza de cereales son 1 rebanada de pan, 1 taza de cereal listo para comer, 3 tazas de palomitas de maz o  taza de arroz, pasta o cereales cocidos. Carnes y otras protenas Propngase comer el equivalente a 5 o 6 onzas de Publishing copy. Algunos ejemplos de equivalentes a 1 onza de protena son 1 huevo,  taza de frutos secos o semillas o 1 cucharada (16 g) de [place] de man. Un corte de carne o pescado del tamao de un mazo de cartas equivale aproximadamente a 3 a 4 onzas. De las protenas que consume cada semana, intente  que al menos 8 onzas provengan de frutos de mar. Esto incluye el salmn, la [place], el arenque y las [place]. Lcteos [employer] a 3 tazas de lcteos descremados o con bajo contenido de Museum/gallery curator. Algunos ejemplos de equivalentes a 1 taza de lcteos son 1 taza (240 ml) de leche, 8 onzas (250 g) de yogur, 1 onzas (44 g) de queso natural o 1 taza (240 ml) de leche de soja fortificada. Grasas y aceites Propngase consumir alrededor de 5 cucharaditas (21 g) por da. Elija grasas monoinsaturadas, como el aceite de canola y de [place], [place], [place] de man y Games developer de los frutos secos, o bien grasas poliinsaturadas, como el aceite de [place], maz y soja, nueces, piones, semillas de ssamo, semillas de girasol y semillas de lino. Bebidas Propngase beber seis vasos de 8 onzas de Warehouse manager. Limite el caf a entre tres y cinco tazas de 8 onzas por Futures trader. Limite el consumo de bebidas con cafena que tengan caloras agregadas, como los refrescos y las bebidas energizantes. Limite el consumo de alcohol a no ms de 1 medida por da si es mujer y no est Orthoptist, y a 2 medidas por da si es hombre. Una medida equivale a 12 onzas de cerveza (355 ml), 5 onzas de vino (148 ml) o 1 onzas de bebidas alcohlicas de alta graduacin (  44 ml). Condimentos y otros alimentos Evite agregar cantidades excesivas de sal a los alimentos. Pruebe darles sabor con hierbas y especias en lugar de sal. Evite agregar azcar a los alimentos. Pruebe usar alios, salsas y productos untables a base de Research scientist (life sciences) de grasas slidas. Esta informacin se basa en las pautas generales de nutricin de los [employer]. [employer]. Para obtener ms informacin, visite [email or web address]. Las cantidades exactas pueden variar en funcin de sus necesidades nutricionales. Resumen Un plan de alimentacin saludable puede ayudarlo a mantener un peso saludable, reducir el riesgo de tener enfermedades crnicas y [place]  activo durante toda su vida. Planifique sus comidas. Asegrese de consumir las porciones correctas de una variedad de alimentos ricos en nutrientes. En lugar de frer, trate de cocinar en el horno, en la plancha o en la parrilla, o hervir los alimentos. Elija opciones saludables en todos los mbitos, como en el hogar, el trabajo, la [place], los restaurantes y [place]. Esta informacin no tiene Theme park manager el consejo del mdico. Asegrese de hacerle al mdico cualquier pregunta que tenga. Document Revised: 09/03/2020 Document Reviewed: 09/03/2020 Elsevier Patient Education  2022 [employer].

## 2020-11-24 ENCOUNTER — Other Ambulatory Visit: Payer: Self-pay | Admitting: Internal Medicine

## 2020-11-24 DIAGNOSIS — M1612 Unilateral primary osteoarthritis, left hip: Secondary | ICD-10-CM

## 2020-11-24 LAB — COMPREHENSIVE METABOLIC PANEL
ALT: 18 IU/L (ref 0–32)
AST: 26 IU/L (ref 0–40)
Albumin/Globulin Ratio: 1.4 (ref 1.2–2.2)
Albumin: 4.3 g/dL (ref 3.8–4.8)
Alkaline Phosphatase: 122 IU/L — ABNORMAL HIGH (ref 44–121)
BUN/Creatinine Ratio: 20 (ref 12–28)
BUN: 18 mg/dL (ref 8–27)
Bilirubin Total: 0.7 mg/dL (ref 0.0–1.2)
CO2: 25 mmol/L (ref 20–29)
Calcium: 9.4 mg/dL (ref 8.7–10.3)
Chloride: 102 mmol/L (ref 96–106)
Creatinine, Ser: 0.91 mg/dL (ref 0.57–1.00)
Globulin, Total: 3 g/dL (ref 1.5–4.5)
Glucose: 92 mg/dL (ref 70–99)
Potassium: 3.5 mmol/L (ref 3.5–5.2)
Sodium: 142 mmol/L (ref 134–144)
Total Protein: 7.3 g/dL (ref 6.0–8.5)
eGFR: 71 mL/min/{1.73_m2} (ref >59)

## 2020-11-24 LAB — TSH: TSH: 0.504 u[IU]/mL (ref 0.450–4.500)

## 2020-11-24 NOTE — Progress Notes (Signed)
Let patient know that the x-ray of her left hip confirms that she has severe arthritis in the hip.  I will refer her to orthopedics for further evaluation and management.

## 2020-11-30 ENCOUNTER — Telehealth: Payer: Self-pay

## 2020-11-30 NOTE — Telephone Encounter (Signed)
Contacted pt to go over lab results pt didn't answer lvm  [employer] interperter: Christiane Ha ID: [id number] Sent a CRM and forward labs to NT to give pt labs when they call back

## 2020-12-06 ENCOUNTER — Ambulatory Visit (INDEPENDENT_AMBULATORY_CARE_PROVIDER_SITE_OTHER): Payer: 59 | Admitting: Physician Assistant

## 2020-12-06 ENCOUNTER — Encounter: Payer: Self-pay | Admitting: Physician Assistant

## 2020-12-06 ENCOUNTER — Other Ambulatory Visit: Payer: Self-pay

## 2020-12-06 VITALS — Ht 62.5 in | Wt 174.4 lb

## 2020-12-06 DIAGNOSIS — M1612 Unilateral primary osteoarthritis, left hip: Secondary | ICD-10-CM

## 2020-12-06 NOTE — Progress Notes (Signed)
Office Visit Note   Patient: Amanda Blanchard           Date of Birth: [birth year]           MRN: [id number] Visit Date: 12/06/2020              Requested by: Ladell Pier, MD [address] [place],  [place] [address] PCP: Ladell Pier, MD   Assessment & Plan: Visit Diagnoses:  1. Unilateral primary osteoarthritis, left hip     Plan: Given the fact the patient has left hip pain that is interfering with her quality of life and activities of daily living accompanied with the radiographic findings recommend left total hip arthroplasty.  Questions were encouraged and answered both patient and her daughter-in-law who accompanies her today.  Risk benefits of surgery discussed with patient and her daughter-in-law by Dr. Ninfa Linden and myself.  Risk include but are not limited to nerve/vessel injury, DVT/PE, wound healing problems, infection, leg length discrepancy, prolonged pain and worsening pain.  Handout on anterior hip replacement was given.  Postoperative protocol discussed with the patient and her family member.  Questions were encouraged and answered at length.  We will work on scheduling of her left total hip arthroplasty in the near future.  Follow-Up Instructions: No follow-ups on file.   Orders:  No orders of the defined types were placed in this encounter.  No orders of the defined types were placed in this encounter.     Procedures: No procedures performed   Clinical Data: No additional findings.   Subjective: No chief complaint on file.   HPI Mrs. Amanda Blanchard comes in today for left hip pain.  She states her left hip pain began 6 months ago.  She has had no known injury.  She states prior to 6 months ago that she had to have off-and-on pain in the left hip.  She now walks with a limp.  She has tried Tylenol and ibuprofen without any real relief.  Feels that her hip pain is getting worse.  She is also tried Mobic.  She is accompanied by her  daughter-in-law she speaks very limited [place].  Patient is nondiabetic.  She does note that she feels like her left leg is slightly shorter than her right. 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  Moderate right hip degenerative changes.  No acute fractures.  Both hips are well located.  Review of Systems See HPI otherwise negative or noncontributory.  Objective: Vital Signs: There were no vitals taken for this visit.  Physical Exam General: Well-developed well-nourished female in no acute distress.  Mood and affect appropriate.  Ambulates without any assistive device. Psych: Alert and oriented x3 Ortho Exam Bilateral hips she has good range of motion of both hips however left hip she has pain with extremes of internal and external rotation.  Dorsiflexion and Plantarflexion bilateral ankles intact.  Calves are supple and nontender bilaterally.  Specialty Comments:  No specialty comments available.  Imaging: No results found.   PMFS History: Patient Active Problem List   Diagnosis Date Noted   Aortic atherosclerosis ([place]) 08/31/2020   Cirrhosis of liver without ascites ([place]) 08/31/2020   Bunion of great toe of right foot 08/31/2020   Obesity (BMI 30.0-34.9) 08/31/2020   Influenza vaccine refused 04/13/2020   Tetanus, diphtheria, and acellular pertussis (Tdap) vaccination declined 04/13/2020  COVID-19 vaccine dose declined 04/13/2020   Stage 3b chronic kidney disease (HCC) 03/09/2020   Acute respiratory failure due to COVID-19 [facility]) 02/11/2020   Essential hypertension 02/11/2020   Hypothyroidism 02/11/2020   HLD (hyperlipidemia) 02/11/2020   Past Medical History:  Diagnosis Date   Hypercholesteremia    Hypertension    Thyroid disease     Family History  Problem Relation Age of Onset   Diabetes Mellitus II Neg Hx     No past surgical history on  file. Social History   Occupational History   Not on file  Tobacco Use   Smoking status: Never   Smokeless tobacco: Never  Substance and Sexual Activity   Alcohol use: Not Currently   Drug use: Not Currently   Sexual activity: Not on file

## 2020-12-09 LAB — FECAL OCCULT BLOOD, IMMUNOCHEMICAL: Fecal Occult Bld: NEGATIVE

## 2021-01-11 ENCOUNTER — Ambulatory Visit: Payer: Self-pay

## 2021-01-25 ENCOUNTER — Other Ambulatory Visit: Payer: Self-pay

## 2021-01-26 ENCOUNTER — Encounter: Payer: Self-pay | Admitting: Sports Medicine

## 2021-01-26 ENCOUNTER — Other Ambulatory Visit: Payer: Self-pay

## 2021-01-26 ENCOUNTER — Ambulatory Visit: Payer: 59 | Admitting: Sports Medicine

## 2021-01-26 DIAGNOSIS — M79671 Pain in right foot: Secondary | ICD-10-CM

## 2021-01-26 DIAGNOSIS — M21612 Bunion of left foot: Secondary | ICD-10-CM | POA: Diagnosis not present

## 2021-01-26 DIAGNOSIS — M79672 Pain in left foot: Secondary | ICD-10-CM | POA: Diagnosis not present

## 2021-01-26 DIAGNOSIS — M21611 Bunion of right foot: Secondary | ICD-10-CM | POA: Diagnosis not present

## 2021-01-26 NOTE — Progress Notes (Signed)
Subjective: Amanda Blanchard is a 66 y.o. female patient who returns to office for evaluation of Right> Left bunion pain. Patient reports that she still has pain but the Voltaren gel and the medicine that the orthopedic doctor gave her for her hip seems to be helping some states that the bunion shields helped as well however the more walking and standing that she does still has some pain at the bunions but not as bad as her hip right now.  Patient is assisted by husband and an interpreter this visit.  Patient Active Problem List   Diagnosis Date Noted   Aortic atherosclerosis ([place]) 08/31/2020   Cirrhosis of liver without ascites ([place]) 08/31/2020   Bunion of great toe of right foot 08/31/2020   Obesity (BMI 30.0-34.9) 08/31/2020   Influenza vaccine refused 04/13/2020   Tetanus, diphtheria, and acellular pertussis (Tdap) vaccination declined 04/13/2020   COVID-19 vaccine dose declined 04/13/2020   Stage 3b chronic kidney disease ([place]) 03/09/2020   Acute respiratory failure due to COVID-19 ([place]) 02/11/2020   Essential hypertension 02/11/2020   Hypothyroidism 02/11/2020   HLD (hyperlipidemia) 02/11/2020    Current Outpatient Medications on File Prior to Visit  Medication Sig Dispense Refill   amLODipine (NORVASC) 5 MG tablet Take 1 tablet (5 mg total) by mouth daily. 30 tablet 6   diclofenac Sodium (VOLTAREN) 1 % GEL Apply 4 g topically 4 (four) times daily. 150 g 1   hydrochlorothiazide (HYDRODIURIL) 12.5 MG tablet Take 1 tablet (12.5 mg total) by mouth daily. 90 tablet 3   levothyroxine (SYNTHROID) 100 MCG tablet Take 1 tablet (100 mcg total) by mouth daily. 30 tablet 6   meloxicam (MOBIC) 15 MG tablet Take 1 tablet (15 mg total) by mouth daily. 30 tablet 3   No current facility-administered medications on file prior to visit.    No Known Allergies  Objective:  General: Alert and oriented x3 in no acute distress  Dermatology: No open lesions bilateral lower extremities, no  webspace macerations, no ecchymosis bilateral, all nails x 10 are well manicured.  Vascular: Dorsalis Pedis and Posterior Tibial pedal pulses 2/4, Capillary Fill Time 3 seconds, (+) pedal hair growth bilateral, no edema bilateral lower extremities, Temperature gradient within normal limits.  Neurology: Johney Maine sensation intact via light touch bilateral.  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  Pes planus foot type.       Assessment and Plan: Problem List Items Addressed This Visit   None Visit Diagnoses     Bilateral bunions    -  Primary   Right greater than left   Foot pain, bilateral            -Complete examination performed -Discussed again treatement options; discussed HAV deformity;conservative and  Surgical management; risks, benefits, alternatives discussed. All patient's questions answered. -Patient reports that she wants to try surgery however at this time I advised her that she is getting surgery for her hip to have the hip surgery first and at minimum in the next 3 months or more we can plan out surgery for her bunions patient is aware that surgery is extensive and can take 3 months or more to recover from with the first month being nonweightbearing -Continue with oral anti-inflammatories as prescribed by orthopedics -Continue with topical Voltaren gel for patient to use twice daily to bunion -Continue with bilateral bunion shields to use  as directed -Continue with good supportive shoes -Patient to return to office after she has had orthopedic hip surgery and has recovered for further discussion about bunion surgery/surgery consult.  Landis Martins, DPM

## 2021-02-01 ENCOUNTER — Other Ambulatory Visit: Payer: Self-pay | Admitting: Physician Assistant

## 2021-02-01 DIAGNOSIS — M1612 Unilateral primary osteoarthritis, left hip: Secondary | ICD-10-CM

## 2021-02-10 NOTE — Pre-Procedure Instructions (Signed)
Surgical Instructions    Your procedure is scheduled on Tuesday, February 15, 2021 at 9:41 AM.  Report to [facility] Main Entrance "A" at 7:40 A.M., then check in with the Admitting office.  Call this number if you have problems the morning of surgery:  [phone]   If you have any questions prior to your surgery date call [phone]: Open Monday-Friday 8am-4pm    Remember:  Do not eat after midnight the night before your surgery  You may drink clear liquids until 6:40 AM the morning of your surgery.   Clear liquids allowed are: Water, Non-Citrus Juices (without pulp), Carbonated Beverages, Clear Tea, Black Coffee Only (NO MILK, CREAM OR POWDERED CREAMER of any kind), and Gatorade.   Enhanced Recovery after Surgery for Orthopedics Enhanced Recovery after Surgery is a protocol used to improve the stress on your body and your recovery after surgery.  Patient Instructions  The day of surgery (if you do NOT have diabetes):  Drink ONE (1) Pre-Surgery Clear Ensure by 6:40 am the morning of surgery   This drink was given to you during your hospital  pre-op appointment visit. Nothing else to drink after completing the  Pre-Surgery Clear Ensure.         If you have questions, please contact your surgeons office.     Take these medicines the morning of surgery with A SIP OF WATER:  amLODipine (NORVASC) levothyroxine (SYNTHROID)   As of today, STOP taking any Aspirin (unless otherwise instructed by your surgeon) Aleve, Naproxen, Ibuprofen, Motrin, Advil, meloxicam (MOBIC), Goody's, BC's, all herbal medications, fish oil, and all vitamins.                     Do NOT Smoke (Tobacco/Vaping) for 24 hours prior to your procedure.  If you use a CPAP at night, you may bring your mask/headgear for your overnight stay.   Contacts, glasses, piercing's, hearing aid's, dentures or partials may not be worn into surgery, please bring cases for these belongings.    For patients admitted to  the hospital, discharge time will be determined by your treatment team.   Patients discharged the day of surgery will not be allowed to drive home, and someone needs to stay with them for 24 hours.  NO VISITORS WILL BE ALLOWED IN PRE-OP WHERE PATIENTS ARE PREPPED FOR SURGERY.  ONLY 1 SUPPORT PERSON MAY BE PRESENT IN THE WAITING ROOM WHILE YOU ARE IN SURGERY.  IF YOU ARE TO BE ADMITTED, ONCE YOU ARE IN YOUR ROOM YOU WILL BE ALLOWED TWO (2) VISITORS. (1) VISITOR MAY STAY OVERNIGHT BUT MUST ARRIVE TO THE ROOM BY 8pm.  Minor children may have two parents present. Special consideration for safety and communication needs will be reviewed on a case by case basis.   Special instructions:   [facility]- Preparing For Surgery  Before surgery, you can play an important role. Because skin is not sterile, your skin needs to be as free of germs as possible. You can reduce the number of germs on your skin by washing with CHG (chlorahexidine gluconate) Soap before surgery.  CHG is an antiseptic cleaner which kills germs and bonds with the skin to continue killing germs even after washing.    Oral Hygiene is also important to reduce your risk of infection.  Remember - BRUSH YOUR TEETH THE MORNING OF SURGERY WITH YOUR REGULAR TOOTHPASTE  Please do not use if you have an allergy to CHG or antibacterial soaps. If your skin  becomes reddened/irritated stop using the CHG.  Do not shave (including legs and underarms) for at least 48 hours prior to first CHG shower. It is OK to shave your face.  Please follow these instructions carefully.   Shower the NIGHT BEFORE SURGERY and the MORNING OF SURGERY  If you chose to wash your hair, wash your hair first as usual with your normal shampoo.  After you shampoo, rinse your hair and body thoroughly to remove the shampoo.  Use CHG Soap as you would any other liquid soap. You can apply CHG directly to the skin and wash gently with a scrungie or a clean washcloth.   Apply  the CHG Soap to your body ONLY FROM THE NECK DOWN.  Do not use on open wounds or open sores. Avoid contact with your eyes, ears, mouth and genitals (private parts). Wash Face and genitals (private parts)  with your normal soap.   Wash thoroughly, paying special attention to the area where your surgery will be performed.  Thoroughly rinse your body with warm water from the neck down.  DO NOT shower/wash with your normal soap after using and rinsing off the CHG Soap.  Pat yourself dry with a CLEAN TOWEL.  Wear CLEAN PAJAMAS to bed the night before surgery  Place CLEAN SHEETS on your bed the night before your surgery  DO NOT SLEEP WITH PETS.   Day of Surgery: Shower with CHG soap. Do not wear jewelry, make up, nail polish, gel polish, artificial nails, or any other type of covering on natural nails including finger and toenails. If patients have artificial nails, gel coating, etc. that need to be removed by a nail salon please have this removed prior to surgery. Surgery may need to be canceled/delayed if the surgeon/anesthesiologist feels like the patient is unable to be adequately monitored. Do not wear lotions, powders, colognes, or deodorant. Do not shave 48 hours prior to surgery. Do not bring valuables to the hospital. [facility] is not responsible for any belongings or valuables. Wear Clean/Comfortable clothing the morning of surgery Remember to brush your teeth WITH YOUR REGULAR TOOTHPASTE.   Please read over the following fact sheets that you were given.   3 days prior to your procedure or After your COVID test   You are not required to quarantine however you are required to wear a well-fitting mask when you are out and around people not in your household. If your mask becomes wet or soiled, replace with a new one.   Wash your hands often with soap and water for 20 seconds or clean your hands with an alcohol-based hand sanitizer that contains at least 60% alcohol.   Do  not share personal items.   Notify your provider:  o if you are in close contact with someone who has COVID  o or if you develop a fever of 100.4 or greater, sneezing, cough, sore throat, shortness of breath or body aches.

## 2021-02-11 ENCOUNTER — Other Ambulatory Visit: Payer: Self-pay

## 2021-02-11 ENCOUNTER — Encounter (HOSPITAL_COMMUNITY)
Admission: RE | Admit: 2021-02-11 | Discharge: 2021-02-11 | Disposition: A | Discharge status: Home / Self Care | Payer: 59 | Source: Ambulatory Visit | Attending: Orthopaedic Surgery | Admitting: Orthopaedic Surgery

## 2021-02-11 ENCOUNTER — Encounter (HOSPITAL_COMMUNITY): Payer: Self-pay

## 2021-02-11 VITALS — BP 143/70 | HR 70 | Temp 98.2°F | Resp 17 | Ht 63.0 in | Wt 166.5 lb

## 2021-02-11 DIAGNOSIS — Z8616 Personal history of COVID-19: Secondary | ICD-10-CM | POA: Insufficient documentation

## 2021-02-11 DIAGNOSIS — Z20822 Contact with and (suspected) exposure to covid-19: Secondary | ICD-10-CM | POA: Diagnosis not present

## 2021-02-11 DIAGNOSIS — M1612 Unilateral primary osteoarthritis, left hip: Secondary | ICD-10-CM | POA: Diagnosis not present

## 2021-02-11 DIAGNOSIS — Z01818 Encounter for other preprocedural examination: Secondary | ICD-10-CM | POA: Diagnosis not present

## 2021-02-11 DIAGNOSIS — E039 Hypothyroidism, unspecified: Secondary | ICD-10-CM | POA: Diagnosis not present

## 2021-02-11 DIAGNOSIS — E7800 Pure hypercholesterolemia, unspecified: Secondary | ICD-10-CM | POA: Insufficient documentation

## 2021-02-11 DIAGNOSIS — I10 Essential (primary) hypertension: Secondary | ICD-10-CM | POA: Diagnosis not present

## 2021-02-11 HISTORY — DX: Unspecified osteoarthritis, unspecified site: M19.90

## 2021-02-11 LAB — CBC
HCT: 39 % (ref 36.0–46.0)
Hemoglobin: 12.7 g/dL (ref 12.0–15.0)
MCH: 29.8 pg (ref 26.0–34.0)
MCHC: 32.6 g/dL (ref 30.0–36.0)
MCV: 91.5 fL (ref 80.0–100.0)
Platelets: 250 10*3/uL (ref 150–400)
RBC: 4.26 MIL/uL (ref 3.87–5.11)
RDW: 14.2 % (ref 11.5–15.5)
WBC: 8.9 10*3/uL (ref 4.0–10.5)
nRBC: 0 % (ref 0.0–0.2)

## 2021-02-11 LAB — BASIC METABOLIC PANEL
Anion gap: 9 (ref 5–15)
BUN: 23 mg/dL (ref 8–23)
CO2: 25 mmol/L (ref 22–32)
Calcium: 9.5 mg/dL (ref 8.9–10.3)
Chloride: 103 mmol/L (ref 98–111)
Creatinine, Ser: 1.27 mg/dL — ABNORMAL HIGH (ref 0.44–1.00)
GFR, Estimated: 48 mL/min — ABNORMAL LOW (ref >60)
Glucose, Bld: 95 mg/dL (ref 70–99)
Potassium: 2.9 mmol/L — ABNORMAL LOW (ref 3.5–5.1)
Sodium: 137 mmol/L (ref 135–145)

## 2021-02-11 LAB — SURGICAL PCR SCREEN
MRSA, PCR: NEGATIVE
Staphylococcus aureus: NEGATIVE

## 2021-02-11 LAB — TYPE AND SCREEN
ABO/RH(D): A NEG
Antibody Screen: NEGATIVE

## 2021-02-11 LAB — GLUCOSE, CAPILLARY: Glucose-Capillary: 80 mg/dL (ref 70–99)

## 2021-02-11 NOTE — Progress Notes (Signed)
PCP - Dr. Karle Plumber Cardiologist - denies  PPM/ICD - denies   Chest x-ray - 02/15/20 EKG - 02/11/21 at PAT Stress Test - denies ECHO - denies Cardiac Cath - denies  Sleep Study - denies  DM- denies  Blood Thinner Instructions: n/a Aspirin Instructions: n/a  ERAS Protcol - yes PRE-SURGERY Ensure- given at PAT  COVID TEST- 02/11/21 at PAT   Anesthesia review: no  Patient denies shortness of breath, fever, cough and chest pain at PAT appointment   All instructions explained to the patient, with a verbal understanding of the material. Patient agrees to go over the instructions while at home for a better understanding. Patient also instructed to wear a mask in public after being tested for COVID-19. The opportunity to ask questions was provided.

## 2021-02-12 LAB — SARS CORONAVIRUS 2 (TAT 6-24 HRS): SARS Coronavirus 2: NEGATIVE

## 2021-02-14 ENCOUNTER — Other Ambulatory Visit: Payer: Self-pay | Admitting: Orthopaedic Surgery

## 2021-02-14 MED ORDER — POTASSIUM CHLORIDE CRYS ER 20 MEQ PO TBCR: 20.0000 meq | EXTENDED_RELEASE_TABLET | Freq: Two times a day (BID) | ORAL | 0 refills | Status: DC

## 2021-02-14 NOTE — Progress Notes (Addendum)
Anesthesia Chart Review:  Case: [id number] Date/Time: 02/15/21 0926   Procedure: Left TOTAL HIP ARTHROPLASTY ANTERIOR APPROACH (Left: Hip)   Anesthesia type: Choice   Pre-op diagnosis: Left hip osteoarthritis / Protrusio   Location: [facility] OR ROOM 07 / [facility] OR   Surgeons: Kathryne Hitch, MD       DISCUSSION: Patient is a Spanish-speaking 66 year old female scheduled for the above procedure.  History includes never smoker, hypercholesterolemia, HTN, hypothyroidism, COVID-19 (admission for PNA, AKI 01/2020, s/p steroid, remdesivir, discharged on O2 2L/[place]). She had elevated LFTs ~ April 2022 with negative viral hepatitis panel on 04/15/20, negative mitochondrial Ab and smooth muscle Ab on 04/15/20, and negative HIV 02/11/20. She denied alcohol and illicit drug use. US showed gallbladder polyp but pete portal vain and normal direction of blood flow to liver. CT imaging consistent with cirrhosis. Patient referred to [facility] but did not show for May appointment. She did follow-up with PCP Dr. Laural Benes with normalization of LFTs on 08/31/20 and 11/23/20. Dr. Laural Benes thought elevated LFTs had likely been related to one or a combination of the following: post-COVID liver injury, liver injury due to remdesivir, atorvastatin (discontinued), or possible underlying NAFLD. She still wanted patient to ultimately follow-up with GI, although this does not seem to have happened yet.   Preoperative labs showed K 2.9. She is on HCTZ 12.5 mg daily, which will be held on the morning of surgery. Results communicated to [facility] at Dr. Eliberto Ivory office. ISTAT8 ordered for day of surgery, but defer additional preoperative orders/recommendations to surgeon, if any. UPDATE: Dr. Magnus Ivan called in KCl 20 mEq BID on 02/14/21.  02/11/21 presurgical COVID-19 test negative.  Anesthesia team to evaluate on the day of surgery.   VS: BP (!) 143/70    Pulse 70    Temp 36.8 C (Oral)    Resp 17    Ht 5\' 3"  (1.6 m)    Wt 75.5 kg    SpO2  100%    BMI 29.49 kg/m    PROVIDERS: , MD is PCP    LABS: Preoperative labs noted. Cr 1.27, previously 0.91 11/23/20. K 2.9, will recheck ISTAT8 on the day of surgery. AST and ALT normal 11/23/20 and 08/31/20.  (all labs ordered are listed, but only abnormal results are displayed)  Labs Reviewed  BASIC METABOLIC PANEL - Abnormal; Notable for the following components:      Result Value   Potassium 2.9 (*)    Creatinine, Ser 1.27 (*)    GFR, Estimated 48 (*)    All other components within normal limits  SARS CORONAVIRUS 2 (TAT 6-24 HRS)  SURGICAL PCR SCREEN  GLUCOSE, CAPILLARY  CBC  TYPE AND SCREEN    IMAGES: CT Abd 05/06/20: IMPRESSION: 1. Cirrhosis and portal venous hypertension, without evidence of hepatocellular carcinoma. 2.  Aortic Atherosclerosis (ICD10-I70.0). 3. Trace perihepatic ascites.   05/08/20 Renal 02/14/20: IMPRESSION: 1. No obstructive uropathy. 2. Mild bilateral renal atrophy and borderline increased renal parenchymal echogenicity most consistent with chronic medical renal disease.    EKG: 02/11/21: SB at 58 bpm   CV: N/A  Past Medical History:  Diagnosis Date   Arthritis    osteoarthritis in left hip   Hypercholesteremia    Hypertension    Thyroid disease     Past Surgical History:  Procedure Laterality Date   BILATERAL SALPINGECTOMY     many years ago    MEDICATIONS:  amLODipine (NORVASC) 5 MG tablet   diclofenac Sodium (VOLTAREN) 1 %  GEL   hydrochlorothiazide (HYDRODIURIL) 12.5 MG tablet   levothyroxine (SYNTHROID) 100 MCG tablet   meloxicam (MOBIC) 15 MG tablet   No current facility-administered medications for this encounter.    Shonna Chock, PA-C Surgical Short Stay/Anesthesiology [facility] Phone [phone] [facility] Phone [phone] 02/14/2021 11:20 AM

## 2021-02-14 NOTE — Anesthesia Preprocedure Evaluation (Addendum)
Anesthesia Evaluation  Patient identified by MRN, date of birth, ID band Patient awake    Reviewed: Allergy & Precautions, NPO status , Patient's Chart, lab work & pertinent test results  Airway Mallampati: II  TM Distance: >3 FB Neck ROM: Full    Dental no notable dental hx.    Pulmonary neg pulmonary ROS,    Pulmonary exam normal        Cardiovascular hypertension, Pt. on medications  Rhythm:Regular Rate:Normal     Neuro/Psych negative neurological ROS  negative psych ROS   GI/Hepatic negative GI ROS, Neg liver ROS,   Endo/Other  Hypothyroidism   Renal/GU   negative genitourinary   Musculoskeletal  (+) Arthritis , Osteoarthritis,    Abdominal Normal abdominal exam  (+)   Peds  Hematology negative hematology ROS (+)   Anesthesia Other Findings   Reproductive/Obstetrics                            Anesthesia Physical Anesthesia Plan  ASA: 2  Anesthesia Plan: MAC and Spinal   Post-op Pain Management:    Induction: Intravenous  PONV Risk Score and Plan: 2 and Ondansetron, Dexamethasone, TIVA, Midazolam and Treatment may vary due to age or medical condition  Airway Management Planned: Simple Face Mask, Natural Airway and Nasal Cannula  Additional Equipment: None  Intra-op Plan:   Post-operative Plan:   Informed Consent: I have reviewed the patients History and Physical, chart, labs and discussed the procedure including the risks, benefits and alternatives for the proposed anesthesia with the patient or authorized representative who has indicated his/her understanding and acceptance.     Dental advisory given and Interpreter used for interveiw  Plan Discussed with: CRNA  Anesthesia Plan Comments: (PAT note written 02/14/2021 by Myra Gianotti, PA-C. For K recheck day of surgery. Spanish speaking. Lab Results      Component                Value               Date                       WBC                      8.9                 02/11/2021                HGB                      12.7                02/11/2021                HCT                      39.0                02/11/2021                MCV  91.5                02/11/2021                PLT                      250                 02/11/2021           Lab Results      Component                Value               Date                      NA                       137                 02/11/2021                K                        2.9 (L)             02/11/2021                CO2                      25                  02/11/2021                GLUCOSE                  95                  02/11/2021                BUN                      23                  02/11/2021                CREATININE               1.27 (H)            02/11/2021                CALCIUM                  9.5                 02/11/2021                EGFR                     71                  11/23/2020                GFRNONAA                 48 (L)              02/11/2021          )

## 2021-02-15 ENCOUNTER — Observation Stay (HOSPITAL_COMMUNITY)
Admission: RE | Admit: 2021-02-15 | Discharge: 2021-02-16 | Disposition: A | Discharge status: Home / Self Care | Payer: 59 | Attending: Orthopaedic Surgery | Admitting: Orthopaedic Surgery

## 2021-02-15 ENCOUNTER — Encounter (HOSPITAL_COMMUNITY): Payer: Self-pay | Admitting: Orthopaedic Surgery

## 2021-02-15 ENCOUNTER — Ambulatory Visit (HOSPITAL_COMMUNITY): Payer: 59 | Admitting: Vascular Surgery

## 2021-02-15 ENCOUNTER — Ambulatory Visit (HOSPITAL_COMMUNITY): Payer: 59

## 2021-02-15 ENCOUNTER — Encounter (HOSPITAL_COMMUNITY)
Admission: RE | Disposition: A | Discharge status: Home / Self Care | Payer: Self-pay | Source: Home / Self Care | Attending: Orthopaedic Surgery

## 2021-02-15 ENCOUNTER — Other Ambulatory Visit: Payer: Self-pay

## 2021-02-15 ENCOUNTER — Observation Stay (HOSPITAL_COMMUNITY): Payer: 59

## 2021-02-15 DIAGNOSIS — N1832 Chronic kidney disease, stage 3b: Secondary | ICD-10-CM | POA: Insufficient documentation

## 2021-02-15 DIAGNOSIS — Z419 Encounter for procedure for purposes other than remedying health state, unspecified: Secondary | ICD-10-CM

## 2021-02-15 DIAGNOSIS — Z8616 Personal history of COVID-19: Secondary | ICD-10-CM | POA: Diagnosis not present

## 2021-02-15 DIAGNOSIS — Z96642 Presence of left artificial hip joint: Secondary | ICD-10-CM

## 2021-02-15 DIAGNOSIS — E039 Hypothyroidism, unspecified: Secondary | ICD-10-CM | POA: Insufficient documentation

## 2021-02-15 DIAGNOSIS — I129 Hypertensive chronic kidney disease with stage 1 through stage 4 chronic kidney disease, or unspecified chronic kidney disease: Secondary | ICD-10-CM | POA: Diagnosis not present

## 2021-02-15 DIAGNOSIS — M1612 Unilateral primary osteoarthritis, left hip: Principal | ICD-10-CM | POA: Insufficient documentation

## 2021-02-15 HISTORY — PX: TOTAL HIP ARTHROPLASTY: SHX124

## 2021-02-15 LAB — POCT I-STAT, CHEM 8
BUN: 22 mg/dL (ref 8–23)
Calcium, Ion: 1.17 mmol/L (ref 1.15–1.40)
Chloride: 108 mmol/L (ref 98–111)
Creatinine, Ser: 1.2 mg/dL — ABNORMAL HIGH (ref 0.44–1.00)
Glucose, Bld: 91 mg/dL (ref 70–99)
HCT: 43 % (ref 36.0–46.0)
Hemoglobin: 14.6 g/dL (ref 12.0–15.0)
Potassium: 3.7 mmol/L (ref 3.5–5.1)
Sodium: 142 mmol/L (ref 135–145)
TCO2: 26 mmol/L (ref 22–32)

## 2021-02-15 LAB — ABO/RH: ABO/RH(D): A NEG

## 2021-02-15 SURGERY — ARTHROPLASTY, HIP, TOTAL, ANTERIOR APPROACH
Anesthesia: Monitor Anesthesia Care | Site: Hip | Laterality: Left

## 2021-02-15 MED ORDER — TRANEXAMIC ACID-NACL 1000-0.7 MG/100ML-% IV SOLN
1000.0000 mg | INTRAVENOUS | Status: AC
  Administered 2021-02-15: 1000 mg via INTRAVENOUS
  Filled 2021-02-15: qty 100

## 2021-02-15 MED ORDER — CEFAZOLIN SODIUM-DEXTROSE 2-4 GM/100ML-% IV SOLN
2.0000 g | INTRAVENOUS | Status: AC
  Administered 2021-02-15: 2 g via INTRAVENOUS
  Filled 2021-02-15: qty 100

## 2021-02-15 MED ORDER — LACTATED RINGERS IV SOLN
INTRAVENOUS | Status: DC | PRN
Start: 2021-02-15 — End: 2021-02-15

## 2021-02-15 MED ORDER — POVIDONE-IODINE 10 % EX SWAB
2.0000 "application " | Freq: Once | CUTANEOUS | Status: AC
  Administered 2021-02-15: 2 via TOPICAL

## 2021-02-15 MED ORDER — MIDAZOLAM HCL 2 MG/2ML IJ SOLN
INTRAMUSCULAR | Status: AC
  Filled 2021-02-15: qty 2

## 2021-02-15 MED ORDER — FENTANYL CITRATE (PF) 250 MCG/5ML IJ SOLN
INTRAMUSCULAR | Status: DC | PRN
Start: 2021-02-15 — End: 2021-02-15
  Administered 2021-02-15: 75 ug via INTRAVENOUS
  Administered 2021-02-15: 50 ug via INTRAVENOUS
  Administered 2021-02-15: 25 ug via INTRAVENOUS
  Administered 2021-02-15 (×2): 50 ug via INTRAVENOUS

## 2021-02-15 MED ORDER — AMLODIPINE BESYLATE 5 MG PO TABS
5.0000 mg | ORAL_TABLET | Freq: Every day | ORAL | Status: DC
  Administered 2021-02-15: 5 mg via ORAL
  Filled 2021-02-15: qty 1

## 2021-02-15 MED ORDER — LIDOCAINE 2% (20 MG/ML) 5 ML SYRINGE
INTRAMUSCULAR | Status: DC | PRN
  Administered 2021-02-15: 40 mg via INTRAVENOUS

## 2021-02-15 MED ORDER — FENTANYL CITRATE (PF) 100 MCG/2ML IJ SOLN: 25.0000 ug | INTRAMUSCULAR | Status: DC | PRN

## 2021-02-15 MED ORDER — ORAL CARE MOUTH RINSE: 15.0000 mL | Freq: Once | OROMUCOSAL | Status: AC

## 2021-02-15 MED ORDER — PHENYLEPHRINE HCL-NACL 20-0.9 MG/250ML-% IV SOLN
INTRAVENOUS | Status: DC | PRN
  Administered 2021-02-15: 35 ug/min via INTRAVENOUS

## 2021-02-15 MED ORDER — MIDAZOLAM HCL 2 MG/2ML IJ SOLN
INTRAMUSCULAR | Status: DC | PRN
  Administered 2021-02-15: 1 mg via INTRAVENOUS

## 2021-02-15 MED ORDER — GLYCOPYRROLATE PF 0.2 MG/ML IJ SOSY
PREFILLED_SYRINGE | INTRAMUSCULAR | Status: DC | PRN
  Administered 2021-02-15: .2 mg via INTRAVENOUS

## 2021-02-15 MED ORDER — PROMETHAZINE HCL 25 MG/ML IJ SOLN: 6.2500 mg | INTRAMUSCULAR | Status: DC | PRN

## 2021-02-15 MED ORDER — CHLORHEXIDINE GLUCONATE 0.12 % MT SOLN
15.0000 mL | Freq: Once | OROMUCOSAL | Status: AC
  Administered 2021-02-15: 15 mL via OROMUCOSAL
  Filled 2021-02-15: qty 15

## 2021-02-15 MED ORDER — ALUM & MAG HYDROXIDE-SIMETH 200-200-20 MG/5ML PO SUSP: 30.0000 mL | ORAL | Status: DC | PRN

## 2021-02-15 MED ORDER — ONDANSETRON HCL 4 MG/2ML IJ SOLN
INTRAMUSCULAR | Status: DC | PRN
  Administered 2021-02-15: 4 mg via INTRAVENOUS

## 2021-02-15 MED ORDER — METOCLOPRAMIDE HCL 5 MG/ML IJ SOLN: 5.0000 mg | Freq: Three times a day (TID) | INTRAMUSCULAR | Status: DC | PRN

## 2021-02-15 MED ORDER — FENTANYL CITRATE (PF) 250 MCG/5ML IJ SOLN
INTRAMUSCULAR | Status: AC
  Filled 2021-02-15: qty 5

## 2021-02-15 MED ORDER — METOCLOPRAMIDE HCL 5 MG PO TABS: 5.0000 mg | ORAL_TABLET | Freq: Three times a day (TID) | ORAL | Status: DC | PRN

## 2021-02-15 MED ORDER — HYDROCHLOROTHIAZIDE 12.5 MG PO TABS
12.5000 mg | ORAL_TABLET | Freq: Every day | ORAL | Status: DC
  Administered 2021-02-15: 12.5 mg via ORAL
  Filled 2021-02-15: qty 1

## 2021-02-15 MED ORDER — PANTOPRAZOLE SODIUM 40 MG PO TBEC
40.0000 mg | DELAYED_RELEASE_TABLET | Freq: Every day | ORAL | Status: DC
  Administered 2021-02-15 – 2021-02-16 (×2): 40 mg via ORAL
  Filled 2021-02-15 (×2): qty 1

## 2021-02-15 MED ORDER — BUPIVACAINE IN DEXTROSE 0.75-8.25 % IT SOLN
INTRATHECAL | Status: DC | PRN
  Administered 2021-02-15: 1.5 mL via INTRATHECAL

## 2021-02-15 MED ORDER — LIDOCAINE 2% (20 MG/ML) 5 ML SYRINGE
INTRAMUSCULAR | Status: AC
  Filled 2021-02-15: qty 5

## 2021-02-15 MED ORDER — OXYCODONE HCL 5 MG PO TABS
5.0000 mg | ORAL_TABLET | ORAL | Status: DC | PRN
  Administered 2021-02-15 (×3): 10 mg via ORAL
  Administered 2021-02-16: 5 mg via ORAL
  Filled 2021-02-15: qty 2
  Filled 2021-02-15: qty 1
  Filled 2021-02-15 (×2): qty 2

## 2021-02-15 MED ORDER — METHOCARBAMOL 1000 MG/10ML IJ SOLN
500.0000 mg | Freq: Four times a day (QID) | INTRAVENOUS | Status: DC | PRN
  Filled 2021-02-15: qty 5

## 2021-02-15 MED ORDER — PROPOFOL 1000 MG/100ML IV EMUL
INTRAVENOUS | Status: AC
  Filled 2021-02-15: qty 100

## 2021-02-15 MED ORDER — PROPOFOL 10 MG/ML IV BOLUS
INTRAVENOUS | Status: DC | PRN
  Administered 2021-02-15: 20 mg via INTRAVENOUS

## 2021-02-15 MED ORDER — LEVOTHYROXINE SODIUM 100 MCG PO TABS
100.0000 ug | ORAL_TABLET | Freq: Every day | ORAL | Status: DC
  Administered 2021-02-15 – 2021-02-16 (×2): 100 ug via ORAL
  Filled 2021-02-15 (×2): qty 1

## 2021-02-15 MED ORDER — ACETAMINOPHEN 325 MG PO TABS
325.0000 mg | ORAL_TABLET | Freq: Four times a day (QID) | ORAL | Status: DC | PRN
  Administered 2021-02-15 – 2021-02-16 (×3): 650 mg via ORAL
  Filled 2021-02-15 (×3): qty 2

## 2021-02-15 MED ORDER — ACETAMINOPHEN 10 MG/ML IV SOLN: 1000.0000 mg | Freq: Once | INTRAVENOUS | Status: DC | PRN

## 2021-02-15 MED ORDER — HYDROMORPHONE HCL 1 MG/ML IJ SOLN: 0.5000 mg | INTRAMUSCULAR | Status: DC | PRN

## 2021-02-15 MED ORDER — ASPIRIN 81 MG PO CHEW
81.0000 mg | CHEWABLE_TABLET | Freq: Two times a day (BID) | ORAL | Status: DC
  Administered 2021-02-15 – 2021-02-16 (×2): 81 mg via ORAL
  Filled 2021-02-15 (×2): qty 1

## 2021-02-15 MED ORDER — 0.9 % SODIUM CHLORIDE (POUR BTL) OPTIME
TOPICAL | Status: DC | PRN
  Administered 2021-02-15: 1000 mL

## 2021-02-15 MED ORDER — GLYCOPYRROLATE PF 0.2 MG/ML IJ SOSY
PREFILLED_SYRINGE | INTRAMUSCULAR | Status: AC
  Filled 2021-02-15: qty 1

## 2021-02-15 MED ORDER — ONDANSETRON HCL 4 MG/2ML IJ SOLN
INTRAMUSCULAR | Status: AC
  Filled 2021-02-15: qty 2

## 2021-02-15 MED ORDER — SODIUM CHLORIDE 0.9 % IV SOLN: INTRAVENOUS | Status: DC

## 2021-02-15 MED ORDER — PROPOFOL 500 MG/50ML IV EMUL
INTRAVENOUS | Status: DC | PRN
  Administered 2021-02-15: 55 ug/kg/min via INTRAVENOUS

## 2021-02-15 MED ORDER — DIPHENHYDRAMINE HCL 12.5 MG/5ML PO ELIX
12.5000 mg | ORAL_SOLUTION | ORAL | Status: DC | PRN
  Filled 2021-02-15: qty 10

## 2021-02-15 MED ORDER — METHOCARBAMOL 500 MG PO TABS
500.0000 mg | ORAL_TABLET | Freq: Four times a day (QID) | ORAL | Status: DC | PRN
  Administered 2021-02-15 – 2021-02-16 (×3): 500 mg via ORAL
  Filled 2021-02-15 (×3): qty 1

## 2021-02-15 MED ORDER — OXYCODONE HCL 5 MG PO TABS: 10.0000 mg | ORAL_TABLET | ORAL | Status: DC | PRN

## 2021-02-15 MED ORDER — LACTATED RINGERS IV SOLN: INTRAVENOUS | Status: DC

## 2021-02-15 MED ORDER — ONDANSETRON HCL 4 MG PO TABS: 4.0000 mg | ORAL_TABLET | Freq: Four times a day (QID) | ORAL | Status: DC | PRN

## 2021-02-15 MED ORDER — DOCUSATE SODIUM 100 MG PO CAPS
100.0000 mg | ORAL_CAPSULE | Freq: Two times a day (BID) | ORAL | Status: DC
  Administered 2021-02-15 – 2021-02-16 (×3): 100 mg via ORAL
  Filled 2021-02-15 (×3): qty 1

## 2021-02-15 MED ORDER — MENTHOL 3 MG MT LOZG: 1.0000 | LOZENGE | OROMUCOSAL | Status: DC | PRN

## 2021-02-15 MED ORDER — CEFAZOLIN SODIUM-DEXTROSE 1-4 GM/50ML-% IV SOLN
1.0000 g | Freq: Four times a day (QID) | INTRAVENOUS | Status: AC
  Administered 2021-02-15 (×2): 1 g via INTRAVENOUS
  Filled 2021-02-15 (×2): qty 50

## 2021-02-15 MED ORDER — SODIUM CHLORIDE 0.9 % IR SOLN
Status: DC | PRN
  Administered 2021-02-15: 1000 mL

## 2021-02-15 MED ORDER — PHENOL 1.4 % MT LIQD: 1.0000 | OROMUCOSAL | Status: DC | PRN

## 2021-02-15 MED ORDER — ONDANSETRON HCL 4 MG/2ML IJ SOLN: 4.0000 mg | Freq: Four times a day (QID) | INTRAMUSCULAR | Status: DC | PRN

## 2021-02-15 SURGICAL SUPPLY — 57 items
BAG COUNTER SPONGE SURGICOUNT (BAG) ×2 IMPLANT
BENZOIN TINCTURE PRP APPL 2/3 (GAUZE/BANDAGES/DRESSINGS) ×2 IMPLANT
BLADE CLIPPER SURG (BLADE) IMPLANT
BLADE SAW SGTL 18X1.27X75 (BLADE) ×2 IMPLANT
COVER SURGICAL LIGHT HANDLE (MISCELLANEOUS) ×2 IMPLANT
CUP ACET PINNACLE SECTR 48MM (Joint) IMPLANT
DRAPE C-ARM 42X72 X-RAY (DRAPES) ×2 IMPLANT
DRAPE STERI IOBAN 125X83 (DRAPES) ×2 IMPLANT
DRAPE U-SHAPE 47X51 STRL (DRAPES) ×6 IMPLANT
DRSG AQUACEL AG ADV 3.5X10 (GAUZE/BANDAGES/DRESSINGS) ×2 IMPLANT
DURAPREP 26ML APPLICATOR (WOUND CARE) ×2 IMPLANT
ELECT BLADE 4.0 EZ CLEAN MEGAD (MISCELLANEOUS) ×2
ELECT BLADE 6.5 EXT (BLADE) IMPLANT
ELECT REM PT RETURN 9FT ADLT (ELECTROSURGICAL) ×2
ELECTRODE BLDE 4.0 EZ CLN MEGD (MISCELLANEOUS) ×1 IMPLANT
ELECTRODE REM PT RTRN 9FT ADLT (ELECTROSURGICAL) ×1 IMPLANT
FACESHIELD WRAPAROUND (MASK) ×4 IMPLANT
FACESHIELD WRAPAROUND OR TEAM (MASK) ×2 IMPLANT
GLOVE SRG 8 PF TXTR STRL LF DI (GLOVE) ×2 IMPLANT
GLOVE SURG LTX SZ8 (GLOVE) ×2 IMPLANT
GLOVE SURG ORTHO LTX SZ7.5 (GLOVE) ×4 IMPLANT
GLOVE SURG UNDER POLY LF SZ8 (GLOVE) ×2
GOWN STRL REUS W/ TWL LRG LVL3 (GOWN DISPOSABLE) ×2 IMPLANT
GOWN STRL REUS W/ TWL XL LVL3 (GOWN DISPOSABLE) ×2 IMPLANT
GOWN STRL REUS W/TWL LRG LVL3 (GOWN DISPOSABLE) ×2
GOWN STRL REUS W/TWL XL LVL3 (GOWN DISPOSABLE) ×2
HANDPIECE INTERPULSE COAX TIP (DISPOSABLE) ×1
HEAD FEMORAL 32 CERAMIC (Hips) ×1 IMPLANT
KIT BASIN OR (CUSTOM PROCEDURE TRAY) ×2 IMPLANT
KIT TURNOVER KIT B (KITS) ×2 IMPLANT
LINER ACET 32X48 (Liner) ×1 IMPLANT
MANIFOLD NEPTUNE II (INSTRUMENTS) ×2 IMPLANT
NS IRRIG 1000ML POUR BTL (IV SOLUTION) ×2 IMPLANT
PACK TOTAL JOINT (CUSTOM PROCEDURE TRAY) ×2 IMPLANT
PAD ARMBOARD 7.5X6 YLW CONV (MISCELLANEOUS) ×2 IMPLANT
PINNSECTOR W/GRIP ACE CUP 48MM (Joint) ×2 IMPLANT
SCREW 6.5MMX25MM (Screw) ×2 IMPLANT
SCREW 6.5MMX30MM (Screw) ×1 IMPLANT
SCREW PINN CAN 6.5X20 (Screw) IMPLANT
SET HNDPC FAN SPRY TIP SCT (DISPOSABLE) ×1 IMPLANT
STAPLER VISISTAT 35W (STAPLE) IMPLANT
STEM FEM ACTIS STD SZ2 (Stem) ×1 IMPLANT
STRIP CLOSURE SKIN 1/2X4 (GAUZE/BANDAGES/DRESSINGS) ×4 IMPLANT
SUT ETHIBOND NAB CT1 #1 30IN (SUTURE) ×2 IMPLANT
SUT MNCRL AB 4-0 PS2 18 (SUTURE) IMPLANT
SUT VIC AB 0 CT1 27 (SUTURE) ×1
SUT VIC AB 0 CT1 27XBRD ANBCTR (SUTURE) ×1 IMPLANT
SUT VIC AB 1 CT1 27 (SUTURE) ×1
SUT VIC AB 1 CT1 27XBRD ANBCTR (SUTURE) ×1 IMPLANT
SUT VIC AB 2-0 CT1 27 (SUTURE) ×1
SUT VIC AB 2-0 CT1 TAPERPNT 27 (SUTURE) ×1 IMPLANT
TOWEL GREEN STERILE (TOWEL DISPOSABLE) ×2 IMPLANT
TOWEL GREEN STERILE FF (TOWEL DISPOSABLE) ×2 IMPLANT
TRAY CATH 16FR W/PLASTIC CATH (SET/KITS/TRAYS/PACK) IMPLANT
TRAY FOLEY W/BAG SLVR 16FR (SET/KITS/TRAYS/PACK)
TRAY FOLEY W/BAG SLVR 16FR ST (SET/KITS/TRAYS/PACK) IMPLANT
WATER STERILE IRR 1000ML POUR (IV SOLUTION) ×4 IMPLANT

## 2021-02-15 NOTE — Op Note (Signed)
NAME: Amanda Blanchard, Amanda Blanchard MEDICAL RECORD NO: [id number] ACCOUNT NO: [id number] DATE OF BIRTH: [birth year] FACILITY: [facility] LOCATION: [facility] PHYSICIAN: Lind Guest. Ninfa Linden, MD  Operative Report   DATE OF PROCEDURE: 02/15/2021  PREOPERATIVE DIAGNOSIS:  Severe end-stage arthritis and degenerative joint disease, left hip, with protrusio.  POSTOPERATIVE DIAGNOSIS:  Severe end-stage arthritis and degenerative joint disease, left hip, with protrusio.  PROCEDURE:  Left total hip arthroplasty through direct anterior approach.  IMPLANTS:  DePuy sector Gription acetabular component size 48 with two screws, size 32+0 neutral polyethylene liner, size 2 ACTIS femoral component with standard offset, size 32+1 ceramic hip ball.  SURGEON:  Lind Guest. Ninfa Linden, MD  ASSISTANT:  Benita Stabile, PA-C  ANESTHESIA:  Spinal.  ANTIBIOTICS:  2 g IV Ancef.  BLOOD LOSS:  200 mL.  COMPLICATIONS:  None.  INDICATIONS:  The patient is a 66 year old female with well-documented severe arthritis of her left hip, and the x-rays also show protrusio acetabuli.  Her pain is daily with her left hip and is detrimentally affecting her mobility, her quality of life,  and her activities of daily living.  We saw her in the clinic and showed her x-rays.  She had family there, but an interpreter as well.  She is non-English speaking.  At this point, her left hip pain is daily and is detrimentally affecting her mobility,  her quality of life, and her activities of daily living to the point she does wish to proceed with a total hip arthroplasty.  We had a long and thorough discussion about the risks and benefits of the surgery including the risk of acute blood loss anemia,  nerve or vessel injury, fracture, dislocation, DVT, implant failure, leg length differences, and skin and soft tissue issues.  We talked about our goals being decreased pain, improved mobility, and overall improved quality of life.  DESCRIPTION OF PROCEDURE:   After informed consent was obtained, appropriate left hip was marked.  She was brought to the operating room and sat up on the stretcher where spinal anesthesia was obtained.  She was laid in supine position on the stretcher.   Foley catheter was placed, and traction boots were placed on both her feet.  Next, she was placed supine on the Hana fracture table, the perineal post in place and both legs in in-line skeletal traction device and no traction applied.  Her left operative  hip was prepped and draped with DuraPrep and sterile drapes.  A timeout was called, and she was identified correct patient, correct left hip.  I then made an incision just inferior and posterior to the anterior superior iliac spine and carried this  obliquely down the leg.  We dissected down tensor fascia lata muscle.  Tensor fascia was then divided longitudinally to proceed with direct anterior approach to the hip.  I identified and cauterized circumflex vessels and identified the hip capsule,  opened up the hip capsule in L-type format finding a moderate joint effusion.  We placed curved retractors around the medial femoral and lateral femoral neck and made a femoral neck cut with oscillating saw just proximal to the lesser trochanter and  completed this in acetabulum.  We placed a corkscrew guide in the femoral head and removed the femoral head in its entirety and found it to be small and completely devoid of cartilage.  I then cleaned remnants of acetabular labrum and other debris from  the acetabulum and placed a bent Hohmann over the medial acetabular rim.  We then gently reamed due  to her protrusio with going more for ream fit, but reaming from a size 42 reamer in stepwise increments up to a size 47 reamer with all reamers placed  under direct visualization and the last reamer was also placed under direct fluoroscopy, so we could obtain our depth of reaming, our inclination and anteversion.  I then placed the real DePuy Sector  Gription acetabular component size 48 and 2 screws to  further secure the acetabular component, even though it did have a tight fit.  I then went with a 32+0 polyethylene liner.  Attention was then turned to the femur.  With the leg externally rotated to 120 degrees, extended and adducted, we were able to  place a Mueller retractor medially and Hohmann retractor behind the greater trochanter. We released lateral joint capsule and used a box cutting osteotome to enter femoral canal and a rongeur to lateralize, then began broaching using the ACTIS broaching  system from a size 0 going to a size 2.  With size 2 in place, we trialed a standard offset femoral neck and a 32+1 trial hip ball.  We brought the leg back over and up and with traction and internal rotation reduced in the pelvis, and we were pleased  with range of motion and stability and offset.  We definitely increased her leg length, but there was no way we could go shorter.  We then dislocated the hip and removed the trial components.  We placed the real ACTIS femoral component size 2 with  standard offset and the real 32+1 ceramic hip ball.  Again, reduced this in the acetabulum.  We were pleased with stability and range of motion.  We assessed this radiographically and clinically.  We then irrigated the soft tissue with normal saline  solution using pulsatile lavage.  We closed the remnants of the joint capsule with #1 Ethibond suture followed by #1 Vicryl to close the tensor fascia.  0 Vicryl was used to close the deep tissue and 2-0 Vicryl was used to close the subcutaneous tissue.   The skin was reapproximated with staples.  An Aquacel dressing was applied.  She was taken off the Hana table and taken to recovery room in stable condition with all final counts being correct and no complications noted.   Of note, Benita Stabile, PA-C, did assist during the entire case, and his assistance was crucial for facilitating all aspects of this  case.   [facility] D: 02/15/2021 10:53:25 am T: 02/15/2021 10:30:00 pm  JOB: [id number]

## 2021-02-15 NOTE — TOC Progression Note (Signed)
Transition of Care [facility]) - Progression Note    Patient Details  Name: Amanda Blanchard MRN: [id number] Date of Birth: 13-Jun-1955  Transition of Care [facility]) CM/SW Contact  Nadene Rubins Adria Devon, RN Phone Number: 02/15/2021, 3:21 PM  Clinical Narrative:     Dr Eliberto Ivory office has arranged home health PT with [facility]. NCM confirmed with Stacie with [facility].   3C staff will provide any needed DME.    Transition of Care [facility]) Screening Note   Patient Details  Name: Amanda Blanchard Date of Birth: 1955/08/22    Transition of Care Department [facility]) has reviewed patient and no TOC needs have been identified at this time. We will continue to monitor patient advancement through interdisciplinary progression rounds. If new patient transition needs arise, please place a TOC consult.         Expected Discharge Plan and Services                                                 Social Determinants of Health (SDOH) Interventions    Readmission Risk Interventions No flowsheet data found.

## 2021-02-15 NOTE — Anesthesia Procedure Notes (Signed)
Spinal  Patient location during procedure: OR Start time: 02/15/2021 9:41 AM End time: 02/15/2021 9:43 AM Staffing Performed: anesthesiologist  Anesthesiologist: Darral Dash, DO Preanesthetic Checklist Completed: patient identified, IV checked, site marked, risks and benefits discussed, surgical consent, monitors and equipment checked, pre-op evaluation and timeout performed Spinal Block Patient position: sitting Prep: DuraPrep Patient monitoring: heart rate, cardiac monitor, continuous pulse ox and blood pressure Approach: midline Location: L4-5 Injection technique: single-shot Needle Needle type: Pencan  Needle gauge: 24 G Needle length: 10 cm Assessment Events: CSF return Additional Notes Patient identified. Risks/Benefits/Options discussed with patient including but not limited to bleeding, infection, nerve damage, paralysis, failed block, incomplete pain control, headache, blood pressure changes, nausea, vomiting, reactions to medications, itching and postpartum back pain. Confirmed with bedside nurse the patient's most recent platelet count. Confirmed with patient that they are not currently taking any anticoagulation, have any bleeding history or any family history of bleeding disorders. Patient expressed understanding and wished to proceed. All questions were answered. Sterile technique was used throughout the entire procedure. Please see nursing notes for vital signs. Warning signs of high block given to the patient including shortness of breath, tingling/numbness in hands, complete motor block, or any concerning symptoms with instructions to call for help. Patient was given instructions on fall risk and not to get out of bed. All questions and concerns addressed with instructions to call with any issues or inadequate analgesia.

## 2021-02-15 NOTE — Anesthesia Postprocedure Evaluation (Signed)
Anesthesia Post Note  Patient: Amanda Blanchard  Procedure(s) Performed: Left TOTAL HIP ARTHROPLASTY ANTERIOR APPROACH (Left: Hip)     Patient location during evaluation: PACU Anesthesia Type: MAC and Spinal Level of consciousness: awake and alert Pain management: pain level controlled Vital Signs Assessment: post-procedure vital signs reviewed and stable Respiratory status: spontaneous breathing, nonlabored ventilation, respiratory function stable and patient connected to nasal cannula oxygen Cardiovascular status: stable and blood pressure returned to baseline Postop Assessment: no apparent nausea or vomiting Anesthetic complications: no   No notable events documented.  Last Vitals:  Vitals:   02/15/21 1409 02/15/21 1701  BP: (!) 108/57 (!) 97/49  Pulse: (!) 51 (!) 56  Resp: 18 16  Temp: 36.5 C 36.7 C  SpO2: 100% 100%    Last Pain:  Vitals:   02/15/21 1701  TempSrc: Oral  PainSc:                  March Rummage Vollie Brunty

## 2021-02-15 NOTE — Brief Op Note (Signed)
02/15/2021  10:54 AM  PATIENT:  Amanda Blanchard  66 y.o. female  PRE-OPERATIVE DIAGNOSIS:  Left hip osteoarthritis / Protrusio  POST-OPERATIVE DIAGNOSIS:  Left hip osteoarthritis / Protrusio  PROCEDURE:  Procedure(s): Left TOTAL HIP ARTHROPLASTY ANTERIOR APPROACH (Left)  SURGEON:  Surgeon(s) and Role:    Kathryne Hitch, MD - Primary  PHYSICIAN ASSISTANT:  Rexene Edison, PA-C  ANESTHESIA:   spinal  EBL:  150 mL   COUNTS:  YES  DICTATION: .Other Dictation: Dictation Number [id number]  PLAN OF CARE: Admit for overnight observation  PATIENT DISPOSITION:  PACU - hemodynamically stable.   Delay start of Pharmacological VTE agent (>24hrs) due to surgical blood loss or risk of bleeding: no

## 2021-02-15 NOTE — Evaluation (Signed)
Physical Therapy Evaluation Patient Details Name: Amanda Blanchard MRN: [id number] DOB: [birth year] Today's Date: 02/15/2021  History of Present Illness  Pt is a 66 y/o female s/p L THA, direct anterior. PMH includes HTN.  Clinical Impression  Pt is s/p surgery above with deficits below. Mobility limited to chair secondary to pain. Requiring min to min guard A for mobility tasks using RW. Reviewed HEP with pt. Reports her son and daughter are available to help at d/c. Will continue to follow acutely to maximize functional mobility independence and safety.        Recommendations for follow up therapy are one component of a multi-disciplinary discharge planning process, led by the attending physician.  Recommendations may be updated based on patient status, additional functional criteria and insurance authorization.  Follow Up Recommendations Follow physician's recommendations for discharge plan and follow up therapies    Assistance Recommended at Discharge Intermittent Supervision/Assistance  Patient can return home with the following       Equipment Recommendations Rolling walker (2 wheels);BSC/3in1  Recommendations for Other Services       Functional Status Assessment Patient has had a recent decline in their functional status and demonstrates the ability to make significant improvements in function in a reasonable and predictable amount of time.     Precautions / Restrictions Precautions Precautions: Fall Restrictions Weight Bearing Restrictions: Yes LLE Weight Bearing: Weight bearing as tolerated      Mobility  Bed Mobility Overal bed mobility: Needs Assistance Bed Mobility: Supine to Sit     Supine to sit: Min assist     General bed mobility comments: Min A for LLE assist. Increased time secondary to pain.    Transfers Overall transfer level: Needs assistance Equipment used: Rolling walker (2 wheels) Transfers: Sit to/from Stand, Bed to  chair/wheelchair/BSC Sit to Stand: Min assist Stand pivot transfers: Min guard         General transfer comment: Min A for lift assist and steadying to stand. Cues for hand placement.  Min guard for safety and cues for sequencint to transfer to chair.    Ambulation/Gait                  Stairs            Wheelchair Mobility    Modified Rankin (Stroke Patients Only)       Balance Overall balance assessment: Needs assistance Sitting-balance support: No upper extremity supported, Feet supported Sitting balance-Leahy Scale: Fair     Standing balance support: Bilateral upper extremity supported, Reliant on assistive device for balance Standing balance-Leahy Scale: Poor Standing balance comment: Reliant on RW                             Pertinent Vitals/Pain Pain Assessment Pain Assessment: Faces Faces Pain Scale: Hurts whole lot Pain Location: L hip Pain Descriptors / Indicators: Grimacing, Guarding, Operative site guarding Pain Intervention(s): Limited activity within patient's tolerance, Monitored during session, Repositioned    Home Living Family/patient expects to be discharged to:: Private residence Living Arrangements: Children Available Help at Discharge: Family Type of Home: House Home Access: Stairs to enter Entrance Stairs-Rails: None Entrance Stairs-Number of Steps: 1   Home Layout: One level Home Equipment: None      Prior Function Prior Level of Function : Independent/Modified Independent                     Hand Dominance  Extremity/Trunk Assessment   Upper Extremity Assessment Upper Extremity Assessment: Overall WFL for tasks assessed    Lower Extremity Assessment Lower Extremity Assessment: LLE deficits/detail LLE Deficits / Details: deficits consistent with post op pain and weakness.    Cervical / Trunk Assessment Cervical / Trunk Assessment: Normal  Communication   Communication: Prefers  language other than [place];Interpreter utilized Psychologist, prison and probation services interpreters-Natalia ([phone]))  Cognition Arousal/Alertness: Awake/alert Behavior During Therapy: WFL for tasks assessed/performed Overall Cognitive Status: Within Functional Limits for tasks assessed                                          General Comments      Exercises Total Joint Exercises Ankle Circles/Pumps: AROM, Both, 20 reps Quad Sets: AROM, Left, 10 reps   Assessment/Plan    PT Assessment Patient needs continued PT services  PT Problem List Decreased strength;Decreased activity tolerance;Decreased balance;Decreased mobility;Decreased knowledge of use of DME;Decreased knowledge of precautions;Pain       PT Treatment Interventions DME instruction;Stair training;Gait training;Therapeutic activities;Functional mobility training;Balance training;Therapeutic exercise;Patient/family education    PT Goals (Current goals can be found in the Care Plan section)  Acute Rehab PT Goals Patient Stated Goal: to decrease pain PT Goal Formulation: With patient Time For Goal Achievement: 03/01/21 Potential to Achieve Goals: Good    Frequency 7X/week     Co-evaluation               AM-PAC PT "6 Clicks" Mobility  Outcome Measure Help needed turning from your back to your side while in a flat bed without using bedrails?: A Little Help needed moving from lying on your back to sitting on the side of a flat bed without using bedrails?: A Little Help needed moving to and from a bed to a chair (including a wheelchair)?: A Little Help needed standing up from a chair using your arms (e.g., wheelchair or bedside chair)?: A Little Help needed to walk in hospital room?: A Little Help needed climbing 3-5 steps with a railing? : A Lot 6 Click Score: 17    End of Session Equipment Utilized During Treatment: Gait belt Activity Tolerance: Patient limited by pain Patient left: in chair;with call bell/phone within  reach Nurse Communication: Mobility status PT Visit Diagnosis: Other abnormalities of gait and mobility (R26.89);Pain Pain - Right/Left: Left Pain - part of body: Hip    Time: 6659-9357 PT Time Calculation (min) (ACUTE ONLY): 22 min   Charges:   PT Evaluation $PT Eval Low Complexity: 1 Low          Cindee Salt, DPT  Acute Rehabilitation Services  Pager: [phone] Office: [phone]   Lehman Prom 02/15/2021, 3:49 PM

## 2021-02-15 NOTE — Transfer of Care (Signed)
Immediate Anesthesia Transfer of Care Note  Patient: Amanda Blanchard  Procedure(s) Performed: Left TOTAL HIP ARTHROPLASTY ANTERIOR APPROACH (Left: Hip)  Patient Location: PACU  Anesthesia Type:MAC and Spinal  Level of Consciousness: drowsy, patient cooperative and responds to stimulation  Airway & Oxygen Therapy: Patient Spontanous Breathing and Patient connected to face mask oxygen  Post-op Assessment: Report given to RN and Post -op Vital signs reviewed and stable  Post vital signs: Reviewed and stable  Last Vitals:  Vitals Value Taken Time  BP    Temp    Pulse    Resp    SpO2      Last Pain:  Vitals:   02/15/21 0806  TempSrc:   PainSc: 10-Worst pain ever      Patients Stated Pain Goal: 4 (01/00/71 2197)  Complications: No notable events documented.

## 2021-02-15 NOTE — H&P (Signed)
TOTAL HIP ADMISSION H&P  Patient is admitted for left total hip arthroplasty.  Subjective:  Chief Complaint: left hip pain  HPI: Amanda Blanchard, 66 y.o. female, has a history of pain and functional disability in the left hip(s) due to arthritis and patient has failed non-surgical conservative treatments for greater than 12 weeks to include NSAID's and/or analgesics and activity modification.  Onset of symptoms was gradual starting 3 years ago with gradually worsening course since that time.The patient noted no past surgery on the left hip(s).  Patient currently rates pain in the left hip at 10 out of 10 with activity. Patient has night pain, worsening of pain with activity and weight bearing, trendelenberg gait, pain that interfers with activities of daily living, and pain with passive range of motion. Patient has evidence of subchondral sclerosis, periarticular osteophytes, joint space narrowing, and protrusio  by imaging studies. This condition presents safety issues increasing the risk of falls.  There is no current active infection.  Patient Active Problem List   Diagnosis Date Noted   Unilateral primary osteoarthritis, left hip 02/15/2021   Aortic atherosclerosis ([place]) 08/31/2020   Cirrhosis of liver without ascites ([place]) 08/31/2020   Bunion of great toe of right foot 08/31/2020   Obesity (BMI 30.0-34.9) 08/31/2020   Influenza vaccine refused 04/13/2020   Tetanus, diphtheria, and acellular pertussis (Tdap) vaccination declined 04/13/2020   COVID-19 vaccine dose declined 04/13/2020   Stage 3b chronic kidney disease ([place]) 03/09/2020   Acute respiratory failure due to COVID-19 ([place]) 02/11/2020   Essential hypertension 02/11/2020   Hypothyroidism 02/11/2020   HLD (hyperlipidemia) 02/11/2020   Past Medical History:  Diagnosis Date   Arthritis    osteoarthritis in left hip   Hypercholesteremia    Hypertension    Thyroid disease     Past Surgical History:  Procedure Laterality  Date   BILATERAL SALPINGECTOMY     many years ago    No current facility-administered medications for this encounter.   No Known Allergies  Social History   Tobacco Use   Smoking status: Never   Smokeless tobacco: Never  Substance Use Topics   Alcohol use: Not Currently    Family History  Problem Relation Age of Onset   Diabetes Mellitus II Neg Hx      Review of Systems  Musculoskeletal:  Positive for gait problem.  All other systems reviewed and are negative.  Objective:  Physical Exam Vitals reviewed.  Constitutional:      Appearance: Normal appearance.  HENT:     Head: Normocephalic and atraumatic.  Eyes:     Extraocular Movements: Extraocular movements intact.     Pupils: Pupils are equal, round, and reactive to light.  Cardiovascular:     Rate and Rhythm: Normal rate and regular rhythm.  Pulmonary:     Effort: Pulmonary effort is normal.     Breath sounds: Normal breath sounds.  Abdominal:     Palpations: Abdomen is soft.  Musculoskeletal:     Cervical back: Normal range of motion and neck supple.     Left hip: Tenderness and bony tenderness present. Decreased range of motion. Decreased strength.  Neurological:     Mental Status: She is alert and oriented to person, place, and time.  Psychiatric:        Behavior: Behavior normal.    Vital signs in last 24 hours:    Labs:   Estimated body mass index is 29.49 kg/m as calculated from the following:   Height as of  02/11/21: 5\' 3"  (1.6 m).   Weight as of 02/11/21: 75.5 kg.   Imaging Review Plain radiographs demonstrate severe degenerative joint disease of the left hip(s). The bone quality appears to be good for age and reported activity level.      Assessment/Plan:  End stage arthritis, left hip(s)  The patient history, physical examination, clinical judgement of the provider and imaging studies are consistent with end stage degenerative joint disease of the left hip(s) and total hip  arthroplasty is deemed medically necessary. The treatment options including medical management, injection therapy, arthroscopy and arthroplasty were discussed at length. The risks and benefits of total hip arthroplasty were presented and reviewed. The risks due to aseptic loosening, infection, stiffness, dislocation/subluxation,  thromboembolic complications and other imponderables were discussed.  The patient acknowledged the explanation, agreed to proceed with the plan and consent was signed. Patient is being admitted for inpatient treatment for surgery, pain control, PT, OT, prophylactic antibiotics, VTE prophylaxis, progressive ambulation and ADL's and discharge planning.The patient is planning to be discharged home with home health services

## 2021-02-16 ENCOUNTER — Encounter (HOSPITAL_COMMUNITY): Payer: Self-pay | Admitting: Orthopaedic Surgery

## 2021-02-16 DIAGNOSIS — M1612 Unilateral primary osteoarthritis, left hip: Secondary | ICD-10-CM | POA: Diagnosis not present

## 2021-02-16 LAB — CBC
HCT: 30.8 % — ABNORMAL LOW (ref 36.0–46.0)
Hemoglobin: 9.8 g/dL — ABNORMAL LOW (ref 12.0–15.0)
MCH: 29.7 pg (ref 26.0–34.0)
MCHC: 31.8 g/dL (ref 30.0–36.0)
MCV: 93.3 fL (ref 80.0–100.0)
Platelets: 186 10*3/uL (ref 150–400)
RBC: 3.3 MIL/uL — ABNORMAL LOW (ref 3.87–5.11)
RDW: 14.8 % (ref 11.5–15.5)
WBC: 7.2 10*3/uL (ref 4.0–10.5)
nRBC: 0 % (ref 0.0–0.2)

## 2021-02-16 LAB — BASIC METABOLIC PANEL
Anion gap: 6 (ref 5–15)
BUN: 17 mg/dL (ref 8–23)
CO2: 27 mmol/L (ref 22–32)
Calcium: 8.6 mg/dL — ABNORMAL LOW (ref 8.9–10.3)
Chloride: 106 mmol/L (ref 98–111)
Creatinine, Ser: 1.38 mg/dL — ABNORMAL HIGH (ref 0.44–1.00)
GFR, Estimated: 43 mL/min — ABNORMAL LOW (ref >60)
Glucose, Bld: 98 mg/dL (ref 70–99)
Potassium: 3.6 mmol/L (ref 3.5–5.1)
Sodium: 139 mmol/L (ref 135–145)

## 2021-02-16 MED ORDER — ASPIRIN 81 MG PO CHEW: 81.0000 mg | CHEWABLE_TABLET | Freq: Two times a day (BID) | ORAL | 0 refills | Status: DC

## 2021-02-16 MED ORDER — OXYCODONE HCL 5 MG PO TABS: 5.0000 mg | ORAL_TABLET | Freq: Four times a day (QID) | ORAL | 0 refills | Status: DC | PRN

## 2021-02-16 MED ORDER — METHOCARBAMOL 500 MG PO TABS: 500.0000 mg | ORAL_TABLET | Freq: Four times a day (QID) | ORAL | 1 refills | Status: DC | PRN

## 2021-02-16 NOTE — Discharge Instructions (Signed)

## 2021-02-16 NOTE — Progress Notes (Signed)
Physical Therapy Treatment Patient Details Name: Amanda Blanchard MRN: [id number] DOB: 1955-09-23 Today's Date: 02/16/2021   History of Present Illness Pt is a 66 y/o female s/p L THA, direct anterior. PMH includes HTN.    PT Comments    Patient making significant progress this date compared to previous session. Patient ambulated 200' with Rw and supervision and able to negotiate 2 stairs with rails and supervision. Daughter present and interpreting. Provided patient with HEP and educated on technique and frequency. D/c plan remains appropriate.     Recommendations for follow up therapy are one component of a multi-disciplinary discharge planning process, led by the attending physician.  Recommendations may be updated based on patient status, additional functional criteria and insurance authorization.  Follow Up Recommendations  Follow physician's recommendations for discharge plan and follow up therapies     Assistance Recommended at Discharge Intermittent Supervision/Assistance  Patient can return home with the following A little help with bathing/dressing/bathroom;Assistance with cooking/housework;Assist for transportation;Help with stairs or ramp for entrance   Equipment Recommendations  Rolling Epiphany Seltzer (2 wheels);BSC/3in1    Recommendations for Other Services       Precautions / Restrictions Precautions Precautions: Fall Restrictions Weight Bearing Restrictions: Yes LLE Weight Bearing: Weight bearing as tolerated     Mobility  Bed Mobility Overal bed mobility: Modified Independent                  Transfers Overall transfer level: Needs assistance Equipment used: Rolling Roxie Gueye (2 wheels) Transfers: Sit to/from Stand Sit to Stand: Supervision           General transfer comment: supervision for safety. No physical assistance required    Ambulation/Gait Ambulation/Gait assistance: Supervision Gait Distance (Feet): 200 Feet Assistive device: Rolling  Vong Garringer (2 wheels) Gait Pattern/deviations: Step-through pattern, Decreased stride length Gait velocity: decreased     General Gait Details: able to progress to step through pattern with supervision.   Stairs Stairs: Yes Stairs assistance: Supervision Stair Management: Two rails, Step to pattern, Forwards Number of Stairs: 2 General stair comments: instructed on up with good and down with bad. Supervision for safety   Wheelchair Mobility    Modified Rankin (Stroke Patients Only)       Balance Overall balance assessment: Needs assistance Sitting-balance support: No upper extremity supported, Feet supported Sitting balance-Leahy Scale: Good     Standing balance support: Bilateral upper extremity supported, Reliant on assistive device for balance Standing balance-Leahy Scale: Poor Standing balance comment: Reliant on RW                            Cognition Arousal/Alertness: Awake/alert Behavior During Therapy: WFL for tasks assessed/performed Overall Cognitive Status: Within Functional Limits for tasks assessed                                          Exercises      General Comments General comments (skin integrity, edema, etc.): Daughter present and translating      Pertinent Vitals/Pain Pain Assessment Pain Assessment: No/denies pain    Home Living                          Prior Function            PT Goals (current goals can now be found in the care  plan section) Acute Rehab PT Goals Patient Stated Goal: to decrease pain PT Goal Formulation: With patient Time For Goal Achievement: 03/01/21 Potential to Achieve Goals: Good Progress towards PT goals: Progressing toward goals    Frequency    7X/week      PT Plan Current plan remains appropriate    Co-evaluation              AM-PAC PT "6 Clicks" Mobility   Outcome Measure  Help needed turning from your back to your side while in a flat bed  without using bedrails?: None Help needed moving from lying on your back to sitting on the side of a flat bed without using bedrails?: None Help needed moving to and from a bed to a chair (including a wheelchair)?: A Little Help needed standing up from a chair using your arms (e.g., wheelchair or bedside chair)?: A Little Help needed to walk in hospital room?: A Little Help needed climbing 3-5 steps with a railing? : A Little 6 Click Score: 20    End of Session Equipment Utilized During Treatment: Gait belt Activity Tolerance: Patient tolerated treatment well Patient left: in bed;with call bell/phone within reach;with family/visitor present Nurse Communication: Mobility status PT Visit Diagnosis: Other abnormalities of gait and mobility (R26.89);Pain Pain - Right/Left: Left Pain - part of body: Hip     Time: 7425-9563 PT Time Calculation (min) (ACUTE ONLY): 20 min  Charges:  $Gait Training: 8-22 mins                     [name] A. Dan Humphreys PT, DPT Acute Rehabilitation Services Pager [phone] Office [phone]    Viviann Spare 02/16/2021, 10:14 AM

## 2021-02-16 NOTE — Discharge Summary (Signed)
Patient ID: Amanda Blanchard MRN: [id number] DOB/AGE: July 08, 1955 66 y.o.  Admit date: 02/15/2021 Discharge date: 02/16/2021  Admission Diagnoses:  Principal Problem:   Unilateral primary osteoarthritis, left hip Active Problems:   Status post total replacement of left hip   Discharge Diagnoses:  Same  Past Medical History:  Diagnosis Date   Arthritis    osteoarthritis in left hip   Hypercholesteremia    Hypertension    Thyroid disease     Surgeries: Procedure(s): Left TOTAL HIP ARTHROPLASTY ANTERIOR APPROACH on 02/15/2021   Consultants:   Discharged Condition: Improved  Hospital Course: Amanda Blanchard is an 66 y.o. female who was admitted 02/15/2021 for operative treatment ofUnilateral primary osteoarthritis, left hip. Patient has severe unremitting pain that affects sleep, daily activities, and work/hobbies. After pre-op clearance the patient was taken to the operating room on 02/15/2021 and underwent  Procedure(s): Left [place].    Patient was given perioperative antibiotics:  Anti-infectives (From admission, onward)    Start     Dose/Rate Route Frequency Ordered Stop   02/15/21 1530  ceFAZolin (ANCEF) IVPB 1 g/50 mL premix        1 g 100 mL/hr over 30 Minutes Intravenous Every 6 hours 02/15/21 1259 02/15/21 2058   02/15/21 0745  ceFAZolin (ANCEF) IVPB 2g/100 mL premix        2 g 200 mL/hr over 30 Minutes Intravenous On call to O.R. 02/15/21 [id number] 02/15/21 0939        Patient was given sequential compression devices, early ambulation, and chemoprophylaxis to prevent DVT.  Patient benefited maximally from hospital stay and there were no complications.    Recent vital signs: Patient Vitals for the past 24 hrs:  BP Temp Temp src Pulse Resp SpO2  02/16/21 0909 (!) 97/52 98.3 F (36.8 C) Oral 67 16 98 %  02/16/21 0608 (!) 94/47 98.5 F (36.9 C) -- 65 18 95 %  02/16/21 0054 (!) 100/46 98.5 F (36.9 C) Oral 62 18 93 %   02/15/21 2128 (!) 104/53 98.6 F (37 C) Oral 66 18 96 %  02/15/21 1701 (!) 97/49 98 F (36.7 C) Oral (!) 56 16 100 %     Recent laboratory studies:  Recent Labs    02/15/21 0822 02/16/21 0726  WBC  --  7.2  HGB 14.6 9.8*  HCT 43.0 30.8*  PLT  --  186  NA 142 139  K 3.7 3.6  CL 108 106  CO2  --  27  BUN 22 17  CREATININE 1.20* 1.38*  GLUCOSE 91 98  CALCIUM  --  8.6*     Discharge Medications:   Allergies as of 02/16/2021   No Known Allergies      Medication List     TAKE these medications    amLODipine 5 MG tablet Commonly known as: NORVASC Take 1 tablet (5 mg total) by mouth daily.   aspirin 81 MG chewable tablet Chew 1 tablet (81 mg total) by mouth 2 (two) times daily.   hydrochlorothiazide 12.5 MG tablet Commonly known as: HYDRODIURIL Take 1 tablet (12.5 mg total) by mouth daily.   levothyroxine 100 MCG tablet Commonly known as: Synthroid Take 1 tablet (100 mcg total) by mouth daily.   meloxicam 15 MG tablet Commonly known as: MOBIC Take 1 tablet (15 mg total) by mouth daily.   methocarbamol 500 MG tablet Commonly known as: ROBAXIN Take 1 tablet (500 mg total) by mouth every 6 (six) hours as needed  for muscle spasms.   oxyCODONE 5 MG immediate release tablet Commonly known as: Oxy IR/ROXICODONE Take 1-2 tablets (5-10 mg total) by mouth every 6 (six) hours as needed for moderate pain (pain score 4-6).   potassium chloride SA 20 MEQ tablet Commonly known as: KLOR-CON M Take 1 tablet (20 mEq total) by mouth 2 (two) times daily.               Durable Medical Equipment  (From admission, onward)           Start     Ordered   02/15/21 1300  DME 3 n 1  Once        02/15/21 1259   02/15/21 1300  DME Walker rolling  Once       Question Answer Comment  Walker: With 5 Inch Wheels   Patient needs a walker to treat with the following condition Status post left hip replacement      02/15/21 1259            Diagnostic Studies: DG  Pelvis Portable  Result Date: 02/15/2021 CLINICAL DATA:  Post left hip replacement EXAM: PORTABLE PELVIS 1-2 VIEWS COMPARISON:  None. FINDINGS: Left total hip arthroplasty with 2 acetabular screws. No complication on this image. Postoperative soft tissue air is noted. IMPRESSION: Post left total hip arthroplasty Electronically Signed   By: Macy Mis M.D.   On: 02/15/2021 14:10   DG C-Arm 1-60 Min-No Report  Result Date: 02/15/2021 Fluoroscopy was utilized by the requesting physician.  No radiographic interpretation.   DG HIP UNILAT WITH PELVIS 1V LEFT  Result Date: 02/15/2021 CLINICAL DATA:  Left total hip replacement EXAM: DG HIP (WITH OR WITHOUT PELVIS) 1V*L* COMPARISON:  None. FINDINGS: A series of fluoroscopic spot images document initially advanced left hip DJD, and subsequent changes of left hip arthroplasty, components projecting in expected location without fracture or dislocation. IMPRESSION: Left hip arthroplasty without apparent complication. Electronically Signed   By: Lucrezia Europe M.D.   On: 02/15/2021 11:02    Disposition: Discharge disposition: 01-Home or [place], [place] Follow up.   Specialty: [facility] Contact information: [place] [address] [phone]         Mcarthur Rossetti, MD Follow up in 2 week(s).   Specialty: Orthopedic Surgery Contact information: [address] [place] [address] [phone]                  Signed: Mcarthur Rossetti 02/16/2021, 2:44 PM

## 2021-02-16 NOTE — Progress Notes (Signed)
Subjective: 1 Day Post-Op Procedure(s) (LRB): Left TOTAL HIP ARTHROPLASTY ANTERIOR APPROACH (Left) Patient reports pain as moderate.  Daughter at the bedside to interpret.  Patient awake and alert and doing well overall.  Objective: Vital signs in last 24 hours: Temp:  [97.2 F (36.2 C)-98.6 F (37 C)] 98.5 F (36.9 C) (02/01 0608) Pulse Rate:  [48-66] 65 (02/01 0608) Resp:  [11-35] 18 (02/01 0608) BP: (90-114)/(46-84) 94/47 (02/01 0608) SpO2:  [93 %-100 %] 95 % (02/01 0608)  Intake/Output from previous day: 01/31 0701 - 02/01 0700 In: 1600 [I.V.:1600] Out: 500 [Urine:350; Blood:150] Intake/Output this shift: No intake/output data recorded.  Recent Labs    02/15/21 0822  HGB 14.6   Recent Labs    02/15/21 0822  HCT 43.0   Recent Labs    02/15/21 0822  NA 142  K 3.7  CL 108  BUN 22  CREATININE 1.20*  GLUCOSE 91   No results for input(s): LABPT, INR in the last 72 hours.  Sensation intact distally Intact pulses distally Dorsiflexion/Plantar flexion intact Incision: dressing C/D/I   Assessment/Plan: 1 Day Post-Op Procedure(s) (LRB): Left TOTAL HIP ARTHROPLASTY ANTERIOR APPROACH (Left) Up with therapy Discharge home with home health      Kathryne Hitch 02/16/2021, 7:47 AM

## 2021-02-17 ENCOUNTER — Telehealth: Payer: Self-pay

## 2021-02-17 NOTE — Telephone Encounter (Signed)
Transition Care Management Follow-up Telephone Call  Call placed with assistance of Spanish Interpreter # [id number]/[employer]  Date of discharge and from where: 02/16/2021, [facility]  How have you been since you were released from the hospital? She said she is doing well, has been walking around and does not have the pain she had prior to surgery.  Any questions or concerns? No  Items Reviewed: Did the pt receive and understand the discharge instructions provided? Yes  Medications obtained and verified? Yes She said she has all of her medications , her only question was should she take what she was taking before she went to the hospital. Instructed her to take what has been ordered and listed on her discharge instructions and she said she understood.  Other? No   Any new allergies since your discharge? No  Dietary orders reviewed? Yes Do you have support at home? Yes  - her husband  Home Care and Equipment/Supplies: Were home health services ordered? yes If so, what is the name of the agency?  [facility]  Has the agency set up a time to come to the patient's home? no Were any new equipment or medical supplies ordered?  Yes: RW What is the name of the medical supply agency? She received it from the hospital  Were you able to get the supplies/equipment? yes Do you have any questions related to the use of the equipment or supplies? No   Functional Questionnaire: (I = Independent and D = Dependent) ADLs: independent but family can assist if needed.   Using RW with ambulation.     Follow up appointments reviewed:  PCP Hospital f/u appt confirmed? Yes  Scheduled to see Dr Laural Benes on 03/24/2021. Specialist Hospital f/u appt confirmed? Yes  Scheduled to see Orthopedic surgeon - 03/01/2021  Are transportation arrangements needed? No  If their condition worsens, is the pt aware to call PCP or go to the Emergency Dept.? Yes Was the patient provided with contact  information for the PCP's office or ED? Yes Was to pt encouraged to call back with questions or concerns? Yes

## 2021-03-01 ENCOUNTER — Other Ambulatory Visit: Payer: Self-pay

## 2021-03-01 ENCOUNTER — Ambulatory Visit (INDEPENDENT_AMBULATORY_CARE_PROVIDER_SITE_OTHER): Payer: 59 | Admitting: Orthopaedic Surgery

## 2021-03-01 DIAGNOSIS — Z96642 Presence of left artificial hip joint: Secondary | ICD-10-CM

## 2021-03-01 MED ORDER — OXYCODONE HCL 5 MG PO TABS: 5.0000 mg | ORAL_TABLET | Freq: Four times a day (QID) | ORAL | 0 refills | Status: DC | PRN

## 2021-03-01 NOTE — Progress Notes (Signed)
The patient comes in with her daughter today.  She is 2 weeks status post a left total hip arthroplasty.  She is ambulate with a walker but feels like she can transition to a cane.  She does have an interpreter with her as well today.  She has been taking a baby aspirin twice a day.  Her daughter says that the incision has been draining quite a bit.  On my exam there is no redness.  The Steri-Strips have been removed and Steri-Strips applied.  However there is abundant drainage on the inferior aspect of her incision of seroma type of fluid.  I was able to aspirate at least 60 cc of fluid from the hip and then I expressed a lot more from the inferior incision.  This did decompress the area.  I decided to place some cricket staples in the inferior aspect of her incision and some Steri-Strips on top of this to hopefully keep this dry.  I then placed a new Aquacel dressing over the incision.  She will stop her baby aspirin twice a day.  I will see her back in 1 week to assess her incision and likely remove this Cricket staples and placed new Steri-Strips.  No x-rays are needed.  All question concerns were answered addressed.  I will send in some more oxycodone as well.

## 2021-03-09 ENCOUNTER — Other Ambulatory Visit: Payer: Self-pay

## 2021-03-09 ENCOUNTER — Encounter: Payer: Self-pay | Admitting: Orthopaedic Surgery

## 2021-03-09 ENCOUNTER — Ambulatory Visit (INDEPENDENT_AMBULATORY_CARE_PROVIDER_SITE_OTHER): Payer: 59 | Admitting: Orthopaedic Surgery

## 2021-03-09 DIAGNOSIS — Z96642 Presence of left artificial hip joint: Secondary | ICD-10-CM

## 2021-03-09 NOTE — Progress Notes (Signed)
The patient is 3 weeks status post a left total hip arthroplasty.  She is having no pain at all.  She is ambulate with a walker.  She does not speak English but her daughter interprets for her.  She is only 66 years old.  At her last visit last week I did place some small staples at the inferior aspect of her incision due to drainage.  She has had dressings on there and reports no drainage at all.  I did remove the dressings and the small staples.  There is no drainage from her incision at all and it is healed over nicely.  I still place some Steri-Strips.  She will continue to increase her activities as comfort allows.  She can transition to a cane from my standpoint.  We will see her back in 4 weeks to see how she is doing overall but no x-rays are needed.

## 2021-03-24 ENCOUNTER — Ambulatory Visit: Payer: Self-pay | Admitting: Internal Medicine

## 2021-04-06 ENCOUNTER — Encounter: Payer: 59 | Admitting: Orthopaedic Surgery

## 2021-04-25 ENCOUNTER — Ambulatory Visit (INDEPENDENT_AMBULATORY_CARE_PROVIDER_SITE_OTHER): Payer: 59 | Admitting: Orthopaedic Surgery

## 2021-04-25 ENCOUNTER — Encounter: Payer: Self-pay | Admitting: Orthopaedic Surgery

## 2021-04-25 DIAGNOSIS — Z96642 Presence of left artificial hip joint: Secondary | ICD-10-CM

## 2021-04-25 NOTE — Progress Notes (Signed)
The patient is now 2 months status post a left total hip arthroplasty.  Her daughter is with her who interprets for her.  She says that she is walking much better overall.  She still has some numbness in her left thigh but no pain.  She reports increased motion and strength.  Today she is walking without an assist device and no significant limp.  She looks good overall. ? ?Her left operative hip moves smoothly and fluidly.  She does have some subjective numbness near the incision but overall looks better and looks good.  Her leg lengths are equal. ? ?She will continue to increase her activities as comfort allows.  We will see her back in 6 months with a standing low AP pelvis and lateral of her left operative hip.  If there are issues before then they know to let us know. ?

## 2021-06-07 ENCOUNTER — Other Ambulatory Visit: Payer: Self-pay

## 2021-06-07 ENCOUNTER — Other Ambulatory Visit: Payer: Self-pay | Admitting: Internal Medicine

## 2021-06-07 ENCOUNTER — Telehealth: Payer: Self-pay | Admitting: Internal Medicine

## 2021-06-07 DIAGNOSIS — I10 Essential (primary) hypertension: Secondary | ICD-10-CM

## 2021-06-07 DIAGNOSIS — R600 Localized edema: Secondary | ICD-10-CM

## 2021-06-07 MED ORDER — HYDROCHLOROTHIAZIDE 12.5 MG PO TABS: 12.5000 mg | ORAL_TABLET | Freq: Every day | ORAL | 0 refills | Status: DC

## 2021-06-07 NOTE — Telephone Encounter (Signed)
Rx already ordered today in another encounter.

## 2021-06-07 NOTE — Telephone Encounter (Signed)
Medication Refill - Medication: hydrochlorothiazide (HYDRODIURIL) 12.5 MG tablet  Has the patient contacted their pharmacy? Yes.    (Agent: If yes, when and what did the pharmacy advise?) Pharmacy is calling   Preferred Pharmacy (with phone number or street name):  [facility], [place] Phone:  [phone]  Fax:  [phone]       Has the patient been seen for an appointment in the last year OR does the patient have an upcoming appointment? Yes.    Agent: Please be advised that RX refills may take up to 3 business days. We ask that you follow-up with your pharmacy.

## 2021-06-16 ENCOUNTER — Encounter: Payer: Self-pay | Admitting: Internal Medicine

## 2021-06-16 ENCOUNTER — Ambulatory Visit: Payer: 59 | Attending: Internal Medicine | Admitting: Internal Medicine

## 2021-06-16 VITALS — BP 128/64 | HR 68 | Temp 98.7°F | Resp 16 | Wt 159.0 lb

## 2021-06-16 DIAGNOSIS — Z1231 Encounter for screening mammogram for malignant neoplasm of breast: Secondary | ICD-10-CM

## 2021-06-16 DIAGNOSIS — Z23 Encounter for immunization: Secondary | ICD-10-CM

## 2021-06-16 DIAGNOSIS — D649 Anemia, unspecified: Secondary | ICD-10-CM | POA: Diagnosis not present

## 2021-06-16 DIAGNOSIS — N1832 Chronic kidney disease, stage 3b: Secondary | ICD-10-CM

## 2021-06-16 DIAGNOSIS — E039 Hypothyroidism, unspecified: Secondary | ICD-10-CM | POA: Diagnosis not present

## 2021-06-16 DIAGNOSIS — L239 Allergic contact dermatitis, unspecified cause: Secondary | ICD-10-CM | POA: Diagnosis not present

## 2021-06-16 DIAGNOSIS — I10 Essential (primary) hypertension: Secondary | ICD-10-CM | POA: Diagnosis not present

## 2021-06-16 MED ORDER — HYDROCHLOROTHIAZIDE 12.5 MG PO TABS: 12.5000 mg | ORAL_TABLET | Freq: Every day | ORAL | 0 refills | Status: DC

## 2021-06-16 MED ORDER — TRIAMCINOLONE ACETONIDE 0.1 % EX CREA: 1.0000 "application " | TOPICAL_CREAM | Freq: Two times a day (BID) | CUTANEOUS | 0 refills | Status: DC

## 2021-06-16 MED ORDER — LEVOTHYROXINE SODIUM 100 MCG PO TABS: 100.0000 ug | ORAL_TABLET | Freq: Every day | ORAL | 6 refills | Status: DC

## 2021-06-16 MED ORDER — AMLODIPINE BESYLATE 5 MG PO TABS: 5.0000 mg | ORAL_TABLET | Freq: Every day | ORAL | 6 refills | Status: DC

## 2021-06-16 NOTE — Progress Notes (Signed)
Red spot on left lower leg x 1 week  Needs refill sent to pharmacy [facility]- [phone]

## 2021-06-16 NOTE — Progress Notes (Signed)
Patient ID: Amanda Blanchard, female    DOB: [birth year]  MRN: [id number]  CC: Hypertension, lab work, and Rash   Subjective: Amanda Blanchard is a 66 y.o. female who presents for chronic  Her concerns today include:  Patient with history of HTN, HL, hypothyroidism, COVID pneumonia 02/2020, CKD 3, markedly abnormal LFTs 03/2020 with cirrhotic changes on imaging.  LFTs have subsequently normalized.  Since last visit with me, patient has had total hip replacement of the left hip by Dr. Ninfa Linden.  She reports she is doing well since the surgery.  Developed some postop anemia with decrease in hemoglobin from 14.6 to 9.8.  Complains of an itchy rash on the left leg x2 weeks.  No initiating factors that she recalls.  However she tells me that she has been working out in the yard in the garden.  HTN: Compliant with Norvasc 5 mg and HCTZ 12.5 mg daily.  She has her bottles with her.  She limits salt in the foods.  Denies chest pains, shortness of breath, headaches, dizziness. GFR has remained stable in the 40s with the latest one being 43.  Last creatinine 1.38.  Hypothyroidism: Reports compliance with levothyroxine 100 mcg daily.  HM: She now has [employer] plan.  States she was called today and was told that she would have to get her mammogram somewhere else as [facility] imaging does not take her insurance agreeable to receiving Tdap today.  She is also agreeable to receiving shingles vaccine.  Patient Active Problem List   Diagnosis Date Noted   Unilateral primary osteoarthritis, left hip 02/15/2021   Status post total replacement of left hip 02/15/2021   Aortic atherosclerosis ([place]) 08/31/2020   Cirrhosis of liver without ascites ([place]) 08/31/2020   Bunion of great toe of right foot 08/31/2020   Obesity (BMI 30.0-34.9) 08/31/2020   Influenza vaccine refused 04/13/2020   Tetanus, diphtheria, and acellular pertussis (Tdap) vaccination declined 04/13/2020   COVID-19 vaccine dose  declined 04/13/2020   Stage 3b chronic kidney disease ([place]) 03/09/2020   Acute respiratory failure due to COVID-19 ([place]) 02/11/2020   Essential hypertension 02/11/2020   Hypothyroidism 02/11/2020   HLD (hyperlipidemia) 02/11/2020     Current Outpatient Medications on File Prior to Visit  Medication Sig Dispense Refill   amLODipine (NORVASC) 5 MG tablet Take 1 tablet (5 mg total) by mouth daily. 30 tablet 6   hydrochlorothiazide (HYDRODIURIL) 12.5 MG tablet Take 1 tablet (12.5 mg total) by mouth daily. 30 tablet 0   levothyroxine (SYNTHROID) 100 MCG tablet Take 1 tablet (100 mcg total) by mouth daily. 30 tablet 6   aspirin 81 MG chewable tablet Chew 1 tablet (81 mg total) by mouth 2 (two) times daily. 30 tablet 0   meloxicam (MOBIC) 15 MG tablet Take 1 tablet (15 mg total) by mouth daily. (Patient not taking: Reported on 06/16/2021) 30 tablet 3   methocarbamol (ROBAXIN) 500 MG tablet Take 1 tablet (500 mg total) by mouth every 6 (six) hours as needed for muscle spasms. (Patient not taking: Reported on 06/16/2021) 30 tablet 1   oxyCODONE (OXY IR/ROXICODONE) 5 MG immediate release tablet Take 1 tablet (5 mg total) by mouth every 6 (six) hours as needed for moderate pain (pain score 4-6). (Patient not taking: Reported on 06/16/2021) 30 tablet 0   potassium chloride SA (KLOR-CON M) 20 MEQ tablet Take 1 tablet (20 mEq total) by mouth 2 (two) times daily. (Patient not taking: Reported on 06/16/2021) 3 tablet 0  No current facility-administered medications on file prior to visit.    No Known Allergies  Social History   Socioeconomic History   Marital status: Married    Spouse name: Not on file   Number of children: 2   Years of education: Not on file   Highest education level: Not on file  Occupational History   Not on file  Tobacco Use   Smoking status: Never   Smokeless tobacco: Never  Vaping Use   Vaping Use: Never used  Substance and Sexual Activity   Alcohol use: Not Currently   Drug  use: Not Currently   Sexual activity: Not on file  Other Topics Concern   Not on file  Social History Narrative   Not on file   Social Determinants of Health   Financial Resource Strain: Not on file  Food Insecurity: Not on file  Transportation Needs: Not on file  Physical Activity: Not on file  Stress: Not on file  Social Connections: Not on file  Intimate Partner Violence: Not on file    Family History  Problem Relation Age of Onset   Diabetes Mellitus II Neg Hx     Past Surgical History:  Procedure Laterality Date   BILATERAL SALPINGECTOMY     many years ago   TOTAL HIP ARTHROPLASTY Left 02/15/2021   Procedure: Left TOTAL HIP ARTHROPLASTY ANTERIOR APPROACH;  Surgeon: Mcarthur Rossetti, MD;  Location: [place];  Service: Orthopedics;  Laterality: Left;    ROS: Review of Systems Negative except as stated above  PHYSICAL EXAM: BP 128/64 (BP Location: Left Arm, Patient Position: Sitting, Cuff Size: Normal)   Pulse 68   Temp 98.7 F (37.1 C) (Oral)   Resp 16   Wt 159 lb (72.1 kg)   SpO2 100%   BMI 28.17 kg/m   Physical Exam  General appearance - alert, well appearing, and in no distress Mental status - normal mood, behavior, speech, dress, motor activity, and thought processes Neck - supple, no significant adenopathy Chest - clear to auscultation, no wheezes, rales or rhonchi, symmetric air entry Heart - normal rate, regular rhythm, normal S1, S2, no murmurs, rubs, clicks or gallops Extremities - peripheral pulses normal, no pedal edema, no clubbing or cyanosis Skin -mildly erythematous rash over the left shin with slight flaking of the skin.  It is not warm to touch.  It is not tender to touch.  No fluctuance appreciated.      Latest Ref Rng & Units 02/16/2021    7:26 AM 02/15/2021    8:22 AM 02/11/2021    3:30 PM  CMP  Glucose 70 - 99 mg/dL 98   91   95    BUN 8 - 23 mg/dL 17   22   23     Creatinine 0.44 - 1.00 mg/dL 1.38   1.20   1.27    Sodium 135 -  145 mmol/L 139   142   137    Potassium 3.5 - 5.1 mmol/L 3.6   3.7   2.9    Chloride 98 - 111 mmol/L 106   108   103    CO2 22 - 32 mmol/L 27    25    Calcium 8.9 - 10.3 mg/dL 8.6    9.5     Lipid Panel     Component Value Date/Time   CHOL 128 04/15/2020 0956   TRIG 150 (H) 04/15/2020 0956   HDL 21 (L) 04/15/2020 0956   CHOLHDL 6.1 (H)  04/15/2020 0956   LDLCALC 80 04/15/2020 0956    CBC    Component Value Date/Time   WBC 7.2 02/16/2021 0726   RBC 3.30 (L) 02/16/2021 0726   HGB 9.8 (L) 02/16/2021 0726   HGB 12.9 04/15/2020 0956   HCT 30.8 (L) 02/16/2021 0726   HCT 37.9 04/15/2020 0956   PLT 186 02/16/2021 0726   PLT 221 04/15/2020 0956   MCV 93.3 02/16/2021 0726   MCV 91 04/15/2020 0956   MCH 29.7 02/16/2021 0726   MCHC 31.8 02/16/2021 0726   RDW 14.8 02/16/2021 0726   RDW 13.7 04/15/2020 0956   LYMPHSABS 0.8 02/16/2020 0341   MONOABS 0.9 02/16/2020 0341   EOSABS 0.0 02/16/2020 0341   BASOSABS 0.0 02/16/2020 0341    ASSESSMENT AND PLAN: 1. Essential hypertension At goal.  Continue HCTZ and amlodipine. - Basic Metabolic Panel - hydrochlorothiazide (HYDRODIURIL) 12.5 MG tablet; Take 1 tablet (12.5 mg total) by mouth daily.  Dispense: 30 tablet; Refill: 0 - amLODipine (NORVASC) 5 MG tablet; Take 1 tablet (5 mg total) by mouth daily.  Dispense: 30 tablet; Refill: 6  2. Allergic dermatitis - triamcinolone cream (KENALOG) 0.1 %; Apply 1 application. topically 2 (two) times daily.  Dispense: 30 g; Refill: 0  3. Normocytic anemia Postop anemia.  We will check CBC to see whether hemoglobin has returned to normal. - CBC  4. Stage 3b chronic kidney disease ([place]) Stable.  Told to avoid NSAIDs.  5. Acquired hypothyroidism Continue levothyroxine. - TSH - levothyroxine (SYNTHROID) 100 MCG tablet; Take 1 tablet (100 mcg total) by mouth daily.  Dispense: 30 tablet; Refill: 6  6. Encounter for screening mammogram for malignant neoplasm of breast I will have the CMA send  mammogram order to [facility] mammography or [facility]; Future  7. Need for Tdap vaccination Given today.  We will plan to give shingles vaccine on next visit.   [employer] Language interpreter used during this encounter. [id number] Rhodes  Patient was given the opportunity to ask questions.  Patient verbalized understanding of the plan and was able to repeat key elements of the plan.   This documentation was completed using Radio producer.  Any transcriptional errors are unintentional.  No orders of the defined types were placed in this encounter.    Requested Prescriptions    No prescriptions requested or ordered in this encounter    No follow-ups on file.  Karle Plumber, MD, FACP

## 2021-06-17 ENCOUNTER — Telehealth: Payer: Self-pay | Admitting: Internal Medicine

## 2021-06-17 DIAGNOSIS — E039 Hypothyroidism, unspecified: Secondary | ICD-10-CM

## 2021-06-17 LAB — CBC
Hematocrit: 40.5 % (ref 34.0–46.6)
Hemoglobin: 13.6 g/dL (ref 11.1–15.9)
MCH: 30.4 pg (ref 26.6–33.0)
MCHC: 33.6 g/dL (ref 31.5–35.7)
MCV: 91 fL (ref 79–97)
Platelets: 236 10*3/uL (ref 150–450)
RBC: 4.47 x10E6/uL (ref 3.77–5.28)
RDW: 13 % (ref 11.7–15.4)
WBC: 9.7 10*3/uL (ref 3.4–10.8)

## 2021-06-17 LAB — BASIC METABOLIC PANEL
BUN/Creatinine Ratio: 20 (ref 12–28)
BUN: 22 mg/dL (ref 8–27)
CO2: 23 mmol/L (ref 20–29)
Calcium: 10.1 mg/dL (ref 8.7–10.3)
Chloride: 98 mmol/L (ref 96–106)
Creatinine, Ser: 1.12 mg/dL — ABNORMAL HIGH (ref 0.57–1.00)
Glucose: 100 mg/dL — ABNORMAL HIGH (ref 70–99)
Potassium: 3.5 mmol/L (ref 3.5–5.2)
Sodium: 139 mmol/L (ref 134–144)
eGFR: 55 mL/min/{1.73_m2} — ABNORMAL LOW (ref >59)

## 2021-06-17 LAB — TSH: TSH: 0.153 u[IU]/mL — ABNORMAL LOW (ref 0.450–4.500)

## 2021-06-17 MED ORDER — LEVOTHYROXINE SODIUM 100 MCG PO TABS: 50.0000 ug | ORAL_TABLET | Freq: Every day | ORAL | 6 refills | Status: DC

## 2021-06-17 NOTE — Telephone Encounter (Signed)
PC placed to pt this a.m to go over lab results using Donald Pore from [employer] [phone]). Pt informed that kidney function is still not 100% but has improved. TSH low at 0.153.  Currently on Levothyroxine 100 mcg.  Advise to decreased to 1/2 tab daily.  F/u in 4 mths at lab to have TSH repeated.  No longer anemic.

## 2021-06-30 ENCOUNTER — Ambulatory Visit: Payer: 59 | Attending: Internal Medicine

## 2021-06-30 DIAGNOSIS — Z23 Encounter for immunization: Secondary | ICD-10-CM

## 2021-07-06 ENCOUNTER — Encounter: Payer: Self-pay | Admitting: Internal Medicine

## 2021-07-06 LAB — HM MAMMOGRAPHY: HM Mammogram: NORMAL (ref 0–4)

## 2021-07-13 ENCOUNTER — Encounter: Payer: Self-pay | Admitting: Internal Medicine

## 2021-08-06 ENCOUNTER — Other Ambulatory Visit: Payer: Self-pay | Admitting: Internal Medicine

## 2021-08-06 DIAGNOSIS — I10 Essential (primary) hypertension: Secondary | ICD-10-CM

## 2021-08-08 NOTE — Telephone Encounter (Signed)
Requested Prescriptions  Pending Prescriptions Disp Refills  . hydrochlorothiazide (MICROZIDE) 12.5 MG capsule [Pharmacy Med Name: HYDROCHLOROT 12.5MG ] 30 capsule 0    Sig: TOME 1 CAPSULA UNA VEZ AL DIA.     Cardiovascular: Diuretics - Thiazide Failed - 08/06/2021 12:44 PM      Failed - Cr in normal range and within 180 days    Creatinine, Ser  Date Value Ref Range Status  06/16/2021 1.12 (H) 0.57 - 1.00 mg/dL Final   Creatinine, Urine  Date Value Ref Range Status  02/14/2020 110.61 mg/dL Final    Comment:    Performed at [facility], [address], [place], [place] [address]         Passed - K in normal range and within 180 days    Potassium  Date Value Ref Range Status  06/16/2021 3.5 3.5 - 5.2 mmol/L Final         Passed - Na in normal range and within 180 days    Sodium  Date Value Ref Range Status  06/16/2021 139 134 - 144 mmol/L Final         Passed - Last BP in normal range    BP Readings from Last 1 Encounters:  06/16/21 128/64         Passed - Valid encounter within last 6 months    Recent Outpatient Visits          1 month ago Essential hypertension   [facility] Marcine Matar, MD   8 months ago Hip pain, chronic, left   [facility] Marcine Matar, MD   10 months ago No-show for appointment   [facility] Marcine Matar, MD   11 months ago Annual physical exam   [facility] Marcine Matar, MD   1 year ago Essential hypertension   [facility] Marcine Matar, MD      Future Appointments            Tomorrow Marcine Matar, MD [facility]   In 2 months Magnus Ivan, Vanita Panda, MD [facility]

## 2021-08-09 ENCOUNTER — Ambulatory Visit: Payer: 59 | Attending: Internal Medicine | Admitting: Internal Medicine

## 2021-08-09 ENCOUNTER — Encounter: Payer: Self-pay | Admitting: Internal Medicine

## 2021-08-09 ENCOUNTER — Ambulatory Visit
Admission: RE | Admit: 2021-08-09 | Discharge: 2021-08-09 | Disposition: A | Discharge status: Home / Self Care | Payer: No Typology Code available for payment source | Source: Ambulatory Visit | Attending: Internal Medicine | Admitting: Internal Medicine

## 2021-08-09 VITALS — BP 110/65 | HR 56 | Temp 97.8°F | Ht 63.0 in | Wt 161.2 lb

## 2021-08-09 DIAGNOSIS — M25571 Pain in right ankle and joints of right foot: Secondary | ICD-10-CM

## 2021-08-09 DIAGNOSIS — E039 Hypothyroidism, unspecified: Secondary | ICD-10-CM | POA: Diagnosis not present

## 2021-08-09 DIAGNOSIS — I10 Essential (primary) hypertension: Secondary | ICD-10-CM | POA: Diagnosis not present

## 2021-08-09 MED ORDER — HYDROCHLOROTHIAZIDE 12.5 MG PO CAPS: ORAL_CAPSULE | ORAL | 1 refills | Status: DC

## 2021-08-09 MED ORDER — PREDNISONE 20 MG PO TABS: ORAL_TABLET | ORAL | 0 refills | Status: DC

## 2021-08-09 MED ORDER — DICLOFENAC SODIUM 1 % EX GEL: 2.0000 g | Freq: Four times a day (QID) | CUTANEOUS | 0 refills | Status: DC

## 2021-08-09 NOTE — Progress Notes (Signed)
Patient ID: Amanda Blanchard, female    DOB: [birth year]  MRN: [id number]  CC: Hypertension   Subjective: Amanda Blanchard is a 66 y.o. female who presents for 1 mth f/u.  Daughter , Huntley Dec, is with her, Her concerns today include:  HTN, HL, hypothyroidism, COVID pneumonia 02/2020, CKD 3, markedly abnormal LFTs 03/2020 with cirrhotic changes on imaging.  LFTs have subsequently normalized.   I went over recent labs from 1 mth ago Anemia had resolved. Kidney function had improved with GFR of 55, previously 43. TSH was low at 0.153.  I recommended dec Levothyroxine form 100 mcg to 50 mcg daily.  She has been doing that.  HTN: Blood pressure elevated on initial check by my nurse.  She took hydrochlorothiazide and amlodipine already for today.  Needing refill on hydrochlorothiazide.    C/o pain RT ankle x 1 wk.  No initiating factors.  Daughter thinks may be due to the hip.  She had left  total hip replacement a few months ago.  Told that she may eventually need the right one done as well.  Patient does not complain of any pain in the hip at this time. -some swelling in the ankle.  Using OTC cream which has not helped. Walks 30 mins daily with daghter.  Pain dec when she rest and elevates.  Patient Active Problem List   Diagnosis Date Noted   Unilateral primary osteoarthritis, left hip 02/15/2021   Status post total replacement of left hip 02/15/2021   Aortic atherosclerosis (HCC) 08/31/2020   Cirrhosis of liver without ascites (HCC) 08/31/2020   Bunion of great toe of right foot 08/31/2020   Obesity (BMI 30.0-34.9) 08/31/2020   Influenza vaccine refused 04/13/2020   Tetanus, diphtheria, and acellular pertussis (Tdap) vaccination declined 04/13/2020   COVID-19 vaccine dose declined 04/13/2020   Stage 3b chronic kidney disease (HCC) 03/09/2020   Essential hypertension 02/11/2020   Hypothyroidism 02/11/2020   HLD (hyperlipidemia) 02/11/2020     Current Outpatient Medications on  File Prior to Visit  Medication Sig Dispense Refill   amLODipine (NORVASC) 5 MG tablet Take 1 tablet (5 mg total) by mouth daily. 30 tablet 6   levothyroxine (SYNTHROID) 100 MCG tablet Take 0.5 tablets (50 mcg total) by mouth daily. 30 tablet 6   triamcinolone cream (KENALOG) 0.1 % Apply 1 application. topically 2 (two) times daily. 30 g 0   aspirin 81 MG chewable tablet Chew 1 tablet (81 mg total) by mouth 2 (two) times daily. (Patient not taking: Reported on 08/09/2021) 30 tablet 0   No current facility-administered medications on file prior to visit.    No Known Allergies  Social History   Socioeconomic History   Marital status: Married    Spouse name: Not on file   Number of children: 2   Years of education: Not on file   Highest education level: Not on file  Occupational History   Not on file  Tobacco Use   Smoking status: Never   Smokeless tobacco: Never  Vaping Use   Vaping Use: Never used  Substance and Sexual Activity   Alcohol use: Not Currently   Drug use: Not Currently   Sexual activity: Not on file  Other Topics Concern   Not on file  Social History Narrative   Not on file   Social Determinants of Health   Financial Resource Strain: Not on file  Food Insecurity: Not on file  Transportation Needs: Not on file  Physical Activity: Not  on file  Stress: Not on file  Social Connections: Not on file  Intimate Partner Violence: Not on file    Family History  Problem Relation Age of Onset   Diabetes Mellitus II Neg Hx     Past Surgical History:  Procedure Laterality Date   BILATERAL SALPINGECTOMY     many years ago   TOTAL HIP ARTHROPLASTY Left 02/15/2021   Procedure: Left TOTAL HIP ARTHROPLASTY ANTERIOR APPROACH;  Surgeon: Kathryne Hitch, MD;  Location: [facility];  Service: Orthopedics;  Laterality: Left;    ROS: Review of Systems Negative except as stated above  PHYSICAL EXAM: BP 110/65   Pulse (!) 56   Temp 97.8 F (36.6 C) (Oral)   Ht 5'  3" (1.6 m)   Wt 161 lb 3.2 oz (73.1 kg)   SpO2 100%   BMI 28.56 kg/m   Physical Exam  General appearance - alert, well appearing, and in no distress Mental status - normal mood, behavior, speech, dress, motor activity, and thought processes Neck -no thyroid enlargement or nodules appreciated. Chest - clear to auscultation, no wheezes, rales or rhonchi, symmetric air entry Heart - normal rate, regular rhythm, normal S1, S2, no murmurs, rubs, clicks or gallops Musculoskeletal -right ankle: Mild to moderate enlargement of the joint with mild soft tissue edema especially over the lateral malleolus.  She has good range of motion.  No erythema appreciated.  No point tenderness.      Latest Ref Rng & Units 06/16/2021    4:35 PM 02/16/2021    7:26 AM 02/15/2021    8:22 AM  CMP  Glucose 70 - 99 mg/dL 454  98  91   BUN 8 - 27 mg/dL 22  17  22    Creatinine 0.57 - 1.00 mg/dL  0.98  1.19   Sodium 134 - 144 mmol/L 139  139  142   Potassium 3.5 - 5.2 mmol/L 3.5  3.6  3.7   Chloride 96 - 106 mmol/L 98  106  108   CO2 20 - 29 mmol/L 23  27    Calcium 8.7 - 10.3 mg/dL 1.47  8.6     Lipid Panel     Component Value Date/Time   CHOL 128 04/15/2020 0956   TRIG 150 (H) 04/15/2020 0956   HDL 21 (L) 04/15/2020 0956   CHOLHDL 6.1 (H) 04/15/2020 0956   LDLCALC 80 04/15/2020 0956    CBC    Component Value Date/Time   WBC 9.7 06/16/2021 1635   WBC 7.2 02/16/2021 0726   RBC 4.47 06/16/2021 1635   RBC 3.30 (L) 02/16/2021 0726   HGB 13.6 06/16/2021 1635   HCT 40.5 06/16/2021 1635   PLT 236 06/16/2021 1635   MCV 91 06/16/2021 1635   MCH 30.4 06/16/2021 1635   MCH 29.7 02/16/2021 0726   MCHC 33.6 06/16/2021 1635   MCHC 31.8 02/16/2021 0726   RDW 13.0 06/16/2021 1635   LYMPHSABS 0.8 02/16/2020 0341   MONOABS 0.9 02/16/2020 0341   EOSABS 0.0 02/16/2020 0341   BASOSABS 0.0 02/16/2020 0341    ASSESSMENT AND PLAN: 1. Acute right ankle pain Differential diagnoses include degenerative  arthritis versus gout.  We will give a course of prednisone and check uric acid level.  Also prescribe Voltaren gel for her to use up to 4 times a day as needed.  Check x-ray of the ankle. - predniSONE (DELTASONE) 20 MG tablet; 1 tab PO daily x 2 days then 1/2 tab daily x  4 days  Dispense: 4 tablet; Refill: 0 - diclofenac Sodium (VOLTAREN) 1 % GEL; Apply 2 g topically 4 (four) times daily.  Dispense: 100 g; Refill: 0 - DG Ankle Complete Right; Future - Uric Acid  2. Acquired hypothyroidism We will recheck thyroid level to see if TSH has normalized.  Continue levothyroxine 50 mcg daily for now. - TSH  3. Essential hypertension At goal.  Continue amlodipine and hydrochlorothiazide.  Refill sent on hydrochlorothiazide. - hydrochlorothiazide (MICROZIDE) 12.5 MG capsule; TOME 1 CAPSULA UNA VEZ AL DIA.  Dispense: 90 capsule; Refill: 1    AMN Language interpreter used during this encounter. #[id number]  Patient was given the opportunity to ask questions.  Patient verbalized understanding of the plan and was able to repeat key elements of the plan.   This documentation was completed using Paediatric nurse.  Any transcriptional errors are unintentional.  Orders Placed This Encounter  Procedures   DG Ankle Complete Right   TSH   Uric Acid     Requested Prescriptions   Signed Prescriptions Disp Refills   hydrochlorothiazide (MICROZIDE) 12.5 MG capsule 90 capsule 1    Sig: TOME 1 CAPSULA UNA VEZ AL DIA.   predniSONE (DELTASONE) 20 MG tablet 4 tablet 0    Sig: 1 tab PO daily x 2 days then 1/2 tab daily x 4 days   diclofenac Sodium (VOLTAREN) 1 % GEL 100 g 0    Sig: Apply 2 g topically 4 (four) times daily.    Return in about 4 months (around 12/10/2021).  Jonah Blue, MD, FACP

## 2021-08-09 NOTE — Progress Notes (Signed)
Pain right ankle

## 2021-08-10 ENCOUNTER — Other Ambulatory Visit: Payer: Self-pay | Admitting: Internal Medicine

## 2021-08-10 DIAGNOSIS — M25571 Pain in right ankle and joints of right foot: Secondary | ICD-10-CM

## 2021-08-10 DIAGNOSIS — E039 Hypothyroidism, unspecified: Secondary | ICD-10-CM

## 2021-08-10 LAB — URIC ACID: Uric Acid: 6.7 mg/dL (ref 3.0–7.2)

## 2021-08-10 LAB — TSH: TSH: 23.8 u[IU]/mL — ABNORMAL HIGH (ref 0.450–4.500)

## 2021-08-10 MED ORDER — LEVOTHYROXINE SODIUM 75 MCG PO TABS: 75.0000 ug | ORAL_TABLET | Freq: Every day | ORAL | 3 refills | Status: DC

## 2021-08-17 ENCOUNTER — Ambulatory Visit: Payer: 59 | Admitting: Orthopaedic Surgery

## 2021-08-18 ENCOUNTER — Ambulatory Visit: Payer: 59 | Admitting: Physician Assistant

## 2021-08-23 ENCOUNTER — Encounter: Payer: Self-pay | Admitting: Physician Assistant

## 2021-08-23 ENCOUNTER — Ambulatory Visit (INDEPENDENT_AMBULATORY_CARE_PROVIDER_SITE_OTHER): Payer: Managed Care, Other (non HMO) | Admitting: Physician Assistant

## 2021-08-23 DIAGNOSIS — M7661 Achilles tendinitis, right leg: Secondary | ICD-10-CM | POA: Diagnosis not present

## 2021-08-23 NOTE — Progress Notes (Signed)
HPI: Amanda Blanchard returns today for new complaint of right ankle pain.  States the pain began about a week and a half ago.  No injury.  Pain posterior aspect of the ankle.  She notes she had redness and swelling.  She was seen by her primary care physician who placed her on Medrol Dosepak and gave her Voltaren gel.  There was some concerns about possible gout.  She does feel that the medications have helped some but she still having pain but not as much swelling posterior aspect the ankle.  Ranks her pain to be 4 out of 10 pain at worst.  Daughter does help interpret for her today.  Both deny any history of gout.  Patient denies any hypersensitivity in the posterior aspect of the ankle with light touch or sheet touching the posterior ankle.  Review of systems see HPI otherwise negative.  Physical exam: General patient is well-developed well-nourished female who is able to get on and off the exam table easily. Psych: Alert and oriented x 3 Bilateral ankles no abnormal warmth erythema or effusion.  No tenderness along the posterior tibial tendons peroneal tendons.  Right ankle tenderness over the mid Achilles no deficit.  Bilateral negative Thompson's test.  No tenderness at the insertion of the Achilles bilaterally.  Calves are supple and nontender bilaterally.  Impression: Right Achilles tendinitis.  Plan: We will send her to formal physical therapy in [place]  for gastrocsoleus stretching modalities to the right Achilles.  She will continue Voltaren gel 4 g 4 times daily to the area over the Achilles but is most painful.  She is placed with 916 since heel lifts bilaterally.  Will see her back in 1 month to see how she is doing overall.  Questions were encouraged and answered at length today

## 2021-09-22 ENCOUNTER — Encounter: Payer: Self-pay | Admitting: Orthopaedic Surgery

## 2021-09-22 ENCOUNTER — Ambulatory Visit (INDEPENDENT_AMBULATORY_CARE_PROVIDER_SITE_OTHER): Payer: Commercial Managed Care - HMO

## 2021-09-22 ENCOUNTER — Ambulatory Visit: Payer: Commercial Managed Care - HMO | Admitting: Orthopaedic Surgery

## 2021-09-22 DIAGNOSIS — Z96642 Presence of left artificial hip joint: Secondary | ICD-10-CM | POA: Diagnosis not present

## 2021-09-22 NOTE — Progress Notes (Signed)
The patient is someone who is in follow-up after having a total hip arthroplasty in late January of this year.  She is only 66 years old.  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  Her daughter is with her to interpret for her.  She says that her mother is very pleased with her hip replacement.  She says it is a family and they are pleased on how she walks much better.  She does have some right hip pain but is not debilitating.  Of more concern is a significant bunion she has of her right great toe and medial ankle pain.  She has seen a podiatrist in [place] but would like to see a specialist here in [place] [employer].  She does walk with a much more normal gait.  Her leg lengths are equal.  Her left operative hip moves smoothly and fluidly.  Her right hip does have some stiffness with rotation but the pain is minimal.  She does have a significant bunion deformity of her right great toe.  She does appear to have a normal-appearing arch and she does have pain along the course of the posterior tibial tendon.  I would like to send her to my partner Dr. Lajoyce Corners so he can assess her right foot and ankle in terms of her bunion deformity and her significant ankle pain.  She said this is the biggest concern for her than her right hip.  If that hip starts bothering her she knows to let us know.  We will see about getting an appointment with Dr. Lajoyce Corners.

## 2021-10-14 ENCOUNTER — Ambulatory Visit: Payer: Self-pay

## 2021-10-14 NOTE — Telephone Encounter (Signed)
  Chief Complaint: eye swelling Symptoms: R eye swelling, discomfort Frequency: today Pertinent Negatives: Patient denies eye pain, vision changes  Disposition: [] ED /[x] Urgent Care (no appt availability in office) / [] Appointment(In office/virtual)/ []  [facility]/ [] Home Care/ [] Refused Recommended Disposition /[]  Mobile Bus/ []  Follow-up with PCP Additional Notes: pt's daughter in law Clarise Cruz states that pt went to UC earlier in the week d/t allergic reaction and gave her injection and medication and hydrocortisone but today she has eye swelling in R eye. I advised that pt would need to go to UC to be seen again since practice has no appts today. She verbalized understanding and would call back if needed.  Reason for Disposition  MODERATE-SEVERE eyelid swelling on one side  (Exception: Due to a mosquito bite.)  Answer Assessment - Initial Assessment Questions 1. ONSET: "When did the swelling start?" (e.g., minutes, hours, days)     today 2. LOCATION: "What part of the eyelids is swollen?"     R eye  3. SEVERITY: "How swollen is it?"     Puffiness  5. PAIN: "Is the swelling painful to touch?" If Yes, ask: "How painful is it?"   (Scale 1-10; mild, moderate or severe)     Discomfort  Protocols used: Eye - Swelling-A-AH

## 2021-10-14 NOTE — Telephone Encounter (Signed)
Noted  

## 2021-10-24 ENCOUNTER — Ambulatory Visit: Payer: 59 | Admitting: Orthopaedic Surgery

## 2021-12-16 ENCOUNTER — Ambulatory Visit: Payer: Commercial Managed Care - HMO | Attending: Internal Medicine | Admitting: Internal Medicine

## 2021-12-16 VITALS — BP 130/74 | HR 71 | Temp 97.5°F | Ht 63.0 in | Wt 163.0 lb

## 2021-12-16 DIAGNOSIS — I10 Essential (primary) hypertension: Secondary | ICD-10-CM | POA: Diagnosis not present

## 2021-12-16 DIAGNOSIS — E039 Hypothyroidism, unspecified: Secondary | ICD-10-CM

## 2021-12-16 DIAGNOSIS — K7460 Unspecified cirrhosis of liver: Secondary | ICD-10-CM

## 2021-12-16 DIAGNOSIS — Z23 Encounter for immunization: Secondary | ICD-10-CM | POA: Diagnosis not present

## 2021-12-16 DIAGNOSIS — Z1211 Encounter for screening for malignant neoplasm of colon: Secondary | ICD-10-CM

## 2021-12-16 DIAGNOSIS — N760 Acute vaginitis: Secondary | ICD-10-CM | POA: Diagnosis not present

## 2021-12-16 DIAGNOSIS — N1831 Chronic kidney disease, stage 3a: Secondary | ICD-10-CM | POA: Diagnosis not present

## 2021-12-16 DIAGNOSIS — I700 Atherosclerosis of aorta: Secondary | ICD-10-CM

## 2021-12-16 MED ORDER — HYDROCHLOROTHIAZIDE 12.5 MG PO CAPS: ORAL_CAPSULE | ORAL | 1 refills | Status: DC

## 2021-12-16 MED ORDER — FLUCONAZOLE 150 MG PO TABS: 150.0000 mg | ORAL_TABLET | Freq: Every day | ORAL | 0 refills | Status: DC

## 2021-12-16 MED ORDER — AMLODIPINE BESYLATE 5 MG PO TABS: 5.0000 mg | ORAL_TABLET | Freq: Every day | ORAL | 6 refills | Status: DC

## 2021-12-16 MED ORDER — LEVOTHYROXINE SODIUM 75 MCG PO TABS: 75.0000 ug | ORAL_TABLET | Freq: Every day | ORAL | 3 refills | Status: DC

## 2021-12-16 NOTE — Progress Notes (Signed)
Patient ID: Amanda Blanchard, female    DOB: [birth year]  MRN: [id number]  CC: Hypothyroidism (Hypothyroidism f/u. Med refill./Vaginal itching, no odor or burning while urinating X2 days. /No to flu vax.)   Subjective: Amanda Blanchard is a 66 y.o. female who presents for chronic ds management Her concerns today include:  HTN, HL, hypothyroidism, COVID pneumonia 02/2020, CKD 3, markedly abnormal LFTs 03/2020 with cirrhotic changes on imaging.  LFTs have subsequently normalized.   Hypothyroid:  TSH on last visit was 23.  Recommended Levothyroxine dose increased from 50 mcg to 75 mcg.  However bottle that she has with her today is 100 mcg 1/2 tab daily which was her previous dose.  Some feeling of being cold a lot No major wgh changes  HYPERTENSION Currently taking: see medication list.  She is on HCTZ and Norvasc 5 mg daily. Med Adherence: [x]  Yes    []  No Medication side effects: []  Yes    [x]  No Adherence with salt restriction: [x]  Yes    []  No Home Monitoring?: [x]  Yes    []  No Monitoring Frequency: Q 2wks Home BP results range:  SOB? []  Yes    [x]  No Chest Pain?: []  Yes    [x]  No Leg swelling?: []  Yes    [x]  No Headaches?: []  Yes    [x]  No Dizziness? []  Yes    [x]  No Comments:   C/o vaginal itching x 3-4 days.  No vaginal dischg. Denies dysuria  Last year, patient had elevation in AST and ALT that eventually normalized.  Screen for acute viral hepatitis was negative.  CT of the abdomen at that time showed changes consistent with cirrhosis and portal venous hypertension without evidence of hepatocellular carcinoma.  Incidental finding of aortic atherosclerosis as well.  Referred to GI and they had left messages for her to call back to schedule but she never did.  Patient does not drink alcoholic beverages  CKD: Kidney function has not been 100% but last GFR had improved to 55.  Creatinine was 1.12.  HM:  declines flu vaccine.  Agrees to 2nd shingles vaccine.  Due for FIT  test Patient Active Problem List   Diagnosis Date Noted   Unilateral primary osteoarthritis, left hip 02/15/2021   Status post total replacement of left hip 02/15/2021   Aortic atherosclerosis (HCC) 08/31/2020   Cirrhosis of liver without ascites (HCC) 08/31/2020   Bunion of great toe of right foot 08/31/2020   Obesity (BMI 30.0-34.9) 08/31/2020   Influenza vaccine refused 04/13/2020   Tetanus, diphtheria, and acellular pertussis (Tdap) vaccination declined 04/13/2020   COVID-19 vaccine dose declined 04/13/2020   Stage 3b chronic kidney disease (HCC) 03/09/2020   Essential hypertension 02/11/2020   Hypothyroidism 02/11/2020   HLD (hyperlipidemia) 02/11/2020     Current Outpatient Medications on File Prior to Visit  Medication Sig Dispense Refill   diclofenac Sodium (VOLTAREN) 1 % GEL Apply 2 g topically 4 (four) times daily. 100 g 0   triamcinolone cream (KENALOG) 0.1 % Apply 1 application. topically 2 (two) times daily. 30 g 0   aspirin 81 MG chewable tablet Chew 1 tablet (81 mg total) by mouth 2 (two) times daily. (Patient not taking: Reported on 12/16/2021) 30 tablet 0   No current facility-administered medications on file prior to visit.    No Known Allergies  Social History   Socioeconomic History   Marital status: Married    Spouse name: Not on file   Number of children: 2  Years of education: Not on file   Highest education level: Not on file  Occupational History   Not on file  Tobacco Use   Smoking status: Never   Smokeless tobacco: Never  Vaping Use   Vaping Use: Never used  Substance and Sexual Activity   Alcohol use: Not Currently   Drug use: Not Currently   Sexual activity: Not on file  Other Topics Concern   Not on file  Social History Narrative   Not on file   Social Determinants of Health   Financial Resource Strain: Not on file  Food Insecurity: Not on file  Transportation Needs: Not on file  Physical Activity: Not on file  Stress: Not on  file  Social Connections: Not on file  Intimate Partner Violence: Not on file    Family History  Problem Relation Age of Onset   Diabetes Mellitus II Neg Hx     Past Surgical History:  Procedure Laterality Date   BILATERAL SALPINGECTOMY     many years ago   TOTAL HIP ARTHROPLASTY Left 02/15/2021   Procedure: Left TOTAL HIP ARTHROPLASTY ANTERIOR APPROACH;  Surgeon: Mcarthur Rossetti, MD;  Location: [facility];  Service: Orthopedics;  Laterality: Left;    ROS: Review of Systems Negative except as stated above  PHYSICAL EXAM: BP 130/74 (BP Location: Left Arm, Patient Position: Sitting, Cuff Size: Normal)   Pulse 71   Temp (!) 97.5 F (36.4 C) (Oral)   Ht 5\' 3"  (1.6 m)   Wt 163 lb (73.9 kg)   SpO2 100%   BMI 28.87 kg/m   Wt Readings from Last 3 Encounters:  12/16/21 163 lb (73.9 kg)  08/09/21 161 lb 3.2 oz (73.1 kg)  06/16/21 159 lb (72.1 kg)    Physical Exam  General appearance - alert, well appearing, and in no distress Mental status - normal mood, behavior, speech, dress, motor activity, and thought processes Chest - clear to auscultation, no wheezes, rales or rhonchi, symmetric air entry Heart - normal rate, regular rhythm, normal S1, S2, no murmurs, rubs, clicks or gallops Abdomen: Normal bowel sounds, soft, nontender, no organomegaly Extremities - peripheral pulses normal, no pedal edema, no clubbing or cyanosis      Latest Ref Rng & Units 06/16/2021    4:35 PM 02/16/2021    7:26 AM 02/15/2021    8:22 AM  CMP  Glucose 70 - 99 mg/dL 100  98  91   BUN 8 - 27 mg/dL 22  17  22    Creatinine 0.57 - 1.00 mg/dL 1.12  1.38  1.20   Sodium 134 - 144 mmol/L 139  139  142   Potassium 3.5 - 5.2 mmol/L 3.5  3.6  3.7   Chloride 96 - 106 mmol/L 98  106  108   CO2 20 - 29 mmol/L 23  27    Calcium 8.7 - 10.3 mg/dL 10.1  8.6     Lipid Panel     Component Value Date/Time   CHOL 128 04/15/2020 0956   TRIG 150 (H) 04/15/2020 0956   HDL 21 (L) 04/15/2020 0956   CHOLHDL 6.1  (H) 04/15/2020 0956   LDLCALC 80 04/15/2020 0956    CBC    Component Value Date/Time   WBC 9.7 06/16/2021 1635   WBC 7.2 02/16/2021 0726   RBC 4.47 06/16/2021 1635   RBC 3.30 (L) 02/16/2021 0726   HGB 13.6 06/16/2021 1635   HCT 40.5 06/16/2021 1635   PLT 236 06/16/2021 1635  MCV 91 06/16/2021 1635   MCH 30.4 06/16/2021 1635   MCH 29.7 02/16/2021 0726   MCHC 33.6 06/16/2021 1635   MCHC 31.8 02/16/2021 0726   RDW 13.0 06/16/2021 1635   LYMPHSABS 0.8 02/16/2020 0341   MONOABS 0.9 02/16/2020 0341   EOSABS 0.0 02/16/2020 0341   BASOSABS 0.0 02/16/2020 0341    ASSESSMENT AND PLAN: 1. Acquired hypothyroidism Advised patient that she should be on higher dose of levothyroxine.  I have printed the prescription for levothyroxine 75 mcg and given it to her to take to her pharmacy.  I told her that this is the dose that she should start taking today. - TSH - levothyroxine (SYNTHROID) 75 MCG tablet; Take 1 tablet (75 mcg total) by mouth daily.  Dispense: 30 tablet; Refill: 3  2. Essential hypertension At goal.  Continue current medications - amLODipine (NORVASC) 5 MG tablet; Take 1 tablet (5 mg total) by mouth daily.  Dispense: 30 tablet; Refill: 6 - hydrochlorothiazide (MICROZIDE) 12.5 MG capsule; TOME 1 CAPSULA UNA VEZ AL DIA.  Dispense: 90 capsule; Refill: 1 - Comprehensive metabolic panel  3. Acute vaginitis Most likely vaginal yeast.  Prescription sent to her pharmacy for Diflucan  4. Stage 3a chronic kidney disease (HCC) Stable.  Will continue to monitor.  5. Cirrhosis of liver without ascites, unspecified hepatic cirrhosis type ([place]) See history above.  Now that she has insurance, we will try to get her in with gastroenterology again for follow-up on this.  Recheck liver function test today. - Comprehensive metabolic panel - Ambulatory referral to Gastroenterology  6. Aortic atherosclerosis ([place]) Incidental finding on CAT scan of the abdomen that was done in April of last  year - Lipid panel  7. Need for shingles vaccine Given today.  8. Screening for colon cancer - Fecal occult blood, imunochemical([facility])    [employer] Language interpreter used during this encounter. #[place], [id number]  Patient was given the opportunity to ask questions.  Patient verbalized understanding of the plan and was able to repeat key elements of the plan.   This documentation was completed using Radio producer.  Any transcriptional errors are unintentional.  Orders Placed This Encounter  Procedures   Fecal occult blood, imunochemical([facility])   Varicella-zoster vaccine IM   TSH   Comprehensive metabolic panel   Lipid panel   Ambulatory referral to Gastroenterology     Requested Prescriptions   Signed Prescriptions Disp Refills   fluconazole (DIFLUCAN) 150 MG tablet 1 tablet 0    Sig: Take 1 tablet (150 mg total) by mouth daily.   amLODipine (NORVASC) 5 MG tablet 30 tablet 6    Sig: Take 1 tablet (5 mg total) by mouth daily.   hydrochlorothiazide (MICROZIDE) 12.5 MG capsule 90 capsule 1    Sig: TOME 1 CAPSULA UNA VEZ AL DIA.   levothyroxine (SYNTHROID) 75 MCG tablet 30 tablet 3    Sig: Take 1 tablet (75 mcg total) by mouth daily.    Return in about 4 months (around 04/17/2022).  Karle Plumber, MD, FACP

## 2021-12-17 LAB — LIPID PANEL
Chol/HDL Ratio: 3.6 ratio (ref 0.0–4.4)
Cholesterol, Total: 270 mg/dL — ABNORMAL HIGH (ref 100–199)
HDL: 74 mg/dL (ref >39)
LDL Chol Calc (NIH): 176 mg/dL — ABNORMAL HIGH (ref 0–99)
Triglycerides: 117 mg/dL (ref 0–149)
VLDL Cholesterol Cal: 20 mg/dL (ref 5–40)

## 2021-12-17 LAB — COMPREHENSIVE METABOLIC PANEL
ALT: 12 IU/L (ref 0–32)
AST: 20 IU/L (ref 0–40)
Albumin/Globulin Ratio: 1.7 (ref 1.2–2.2)
Albumin: 4.4 g/dL (ref 3.9–4.9)
Alkaline Phosphatase: 86 IU/L (ref 44–121)
BUN/Creatinine Ratio: 24 (ref 12–28)
BUN: 26 mg/dL (ref 8–27)
Bilirubin Total: 0.6 mg/dL (ref 0.0–1.2)
CO2: 25 mmol/L (ref 20–29)
Calcium: 9.8 mg/dL (ref 8.7–10.3)
Chloride: 103 mmol/L (ref 96–106)
Creatinine, Ser: 1.09 mg/dL — ABNORMAL HIGH (ref 0.57–1.00)
Globulin, Total: 2.6 g/dL (ref 1.5–4.5)
Glucose: 83 mg/dL (ref 70–99)
Potassium: 3.8 mmol/L (ref 3.5–5.2)
Sodium: 142 mmol/L (ref 134–144)
Total Protein: 7 g/dL (ref 6.0–8.5)
eGFR: 57 mL/min/{1.73_m2} — ABNORMAL LOW (ref >59)

## 2021-12-17 LAB — TSH: TSH: 45.2 u[IU]/mL — ABNORMAL HIGH (ref 0.450–4.500)

## 2021-12-21 ENCOUNTER — Telehealth: Payer: Self-pay | Admitting: Internal Medicine

## 2021-12-21 DIAGNOSIS — E782 Mixed hyperlipidemia: Secondary | ICD-10-CM

## 2021-12-21 NOTE — Telephone Encounter (Signed)
Referral submitted to a nutritionist for hyperlipidemia.

## 2021-12-21 NOTE — Telephone Encounter (Signed)
Pt. Given lab results and instructions. Would like referral to nutritionist. Please advise.

## 2021-12-21 NOTE — Addendum Note (Signed)
Addended by: Jonah Blue B on: 12/21/2021 03:24 PM   Modules accepted: Orders

## 2022-01-07 LAB — FECAL OCCULT BLOOD, IMMUNOCHEMICAL: Fecal Occult Bld: NEGATIVE

## 2022-03-23 ENCOUNTER — Encounter: Payer: Self-pay | Admitting: Radiology

## 2022-04-18 ENCOUNTER — Ambulatory Visit: Payer: Self-pay | Attending: Internal Medicine | Admitting: Internal Medicine

## 2022-04-18 ENCOUNTER — Other Ambulatory Visit: Payer: Self-pay | Admitting: Internal Medicine

## 2022-04-18 ENCOUNTER — Encounter: Payer: Self-pay | Admitting: Internal Medicine

## 2022-04-18 VITALS — BP 126/78 | HR 65 | Temp 97.7°F | Ht 63.0 in | Wt 164.0 lb

## 2022-04-18 DIAGNOSIS — E039 Hypothyroidism, unspecified: Secondary | ICD-10-CM

## 2022-04-18 DIAGNOSIS — K7460 Unspecified cirrhosis of liver: Secondary | ICD-10-CM

## 2022-04-18 DIAGNOSIS — I10 Essential (primary) hypertension: Secondary | ICD-10-CM

## 2022-04-18 DIAGNOSIS — E782 Mixed hyperlipidemia: Secondary | ICD-10-CM

## 2022-04-18 DIAGNOSIS — N1831 Chronic kidney disease, stage 3a: Secondary | ICD-10-CM

## 2022-04-18 DIAGNOSIS — J302 Other seasonal allergic rhinitis: Secondary | ICD-10-CM

## 2022-04-18 DIAGNOSIS — I700 Atherosclerosis of aorta: Secondary | ICD-10-CM

## 2022-04-18 MED ORDER — HYDROCHLOROTHIAZIDE 12.5 MG PO CAPS: ORAL_CAPSULE | ORAL | 1 refills | Status: DC

## 2022-04-18 MED ORDER — LORATADINE 10 MG PO TABS: 10.0000 mg | ORAL_TABLET | Freq: Every day | ORAL | 11 refills | Status: DC

## 2022-04-18 NOTE — Patient Instructions (Signed)
Dislipidemia Dyslipidemia La dislipidemia es un desequilibrio de sustancias cerosas parecidas a la grasa (lpidos) en la sangre. El cuerpo necesita lpidos en pequeas cantidades. Con frecuencia, la dislipidemia implica un nivel alto de colesterol o triglicridos, que son tipos de lpidos. Las formas frecuentes de dislipidemia incluyen las siguientes: Niveles elevados de colesterol LDL. El LDL es el tipo de colesterol que causa la acumulacin de depsitos de grasa (placas) en los vasos sanguneos que transportan la sangre fuera del corazn (arterias). Niveles bajos de colesterol HDL. El HDL es el tipo de colesterol que brinda proteccin contra las enfermedades cardacas. Los niveles altos de HDL eliminan la acumulacin de LDL de las arterias. Niveles altos de triglicridos. Los triglicridos son una sustancia grasa presente en la sangre que se relaciona con la acumulacin de placa en las arterias. Cules son las causas? Hay dos tipos principales de dislipidemia: primaria y secundaria. La dislipidemia primaria es causada por cambios (mutaciones) en los genes que se transmiten a travs de las familias (se heredan). Estas mutaciones causan varios tipos de dislipidemia. La dislipidemia secundaria puede ser causada por diversos factores de riesgo que pueden provocar la enfermedad, como las opciones de estilo de vida y ciertas enfermedades. Qu incrementa el riesgo? Tiene ms probabilidades de desarrollar esta afeccin si es un hombre mayor o si es una mujer que ha pasado por la menopausia. Otros factores de riesgo son los siguientes: Tener antecedentes familiares de dislipidemia. Tomar determinados medicamentos, entre ellos, pldoras anticonceptivas, corticoesteroides, algunos diurticos y betabloqueantes. Seguir una dieta con alto contenido de grasas saturadas. Fumar cigarrillos o beber alcohol en exceso. Tener ciertas afecciones mdicas, como diabetes, sndrome de ovario poliqustico (SOP), enfermedad  renal, enfermedad heptica o hipotiroidismo. No hacer ejercicio regularmente. Tener sobrepeso o ser obeso con demasiada grasa en el abdomen. Cules son los signos o sntomas? En la mayora de los casos, la dislipidemia no causa ningn sntoma. En los casos graves, los niveles muy altos de lpidos pueden causar: Protuberancias de grasa debajo de la piel (xantomas). Un anillo blanco o gris alrededor del centro negro (pupila) del ojo. Los niveles muy altos de triglicridos pueden causar inflamacin del pncreas (pancreatitis). Cmo se diagnostica? Su mdico puede diagnosticar dislipidemia basndose en un anlisis de sangre de rutina (anlisis de sangre en ayunas). Como la mayora de las personas no tienen sntomas de la afeccin, este anlisis de sangre (perfil de lpidos) se realiza en adultos mayores de 20 aos y se repite cada 4 a 6 aos. En este anlisis, se controla lo siguiente: Colesterol total. Esto mide la cantidad total de colesterol en la sangre, que incluye el colesterol LDL, el colesterol HDL y los triglicridos. Un valor saludable est por debajo de 200 mg/dl (5.17 mmol/l). Colesterol LDL. El valor objetivo de colesterol LDL es diferente para cada persona, en funcin de los factores de riesgo individuales. Un valor saludable suele estar por debajo de 100 mg/dl (2.59 mmol/l). Consulte al mdico cul debe ser el valor del colesterol LDL para usted. Colesterol HDL. Un nivel de colesterol HDL de 60 mg/dl (1.55 mmol/l) o superior es lo mejor porque ayuda a proteger contra las enfermedades cardacas. Un valor por debajo de 40 mg/dl (1.03 mmol/l) para los hombres o por debajo de 50 mg/dl (1.29 mmol/l) para las mujeres aumenta el riesgo de enfermedad cardaca. Triglicridos. Un valor de triglicridos saludable est por debajo de 150 mg/dl (1.69 mmol/l). Si su perfil de lpidos es anormal, su mdico puede realizar otros anlisis de sangre. Cmo se trata? El tratamiento   depende del tipo de  dislipidemia que usted tenga y sus otros factores de riesgo de enfermedades cardacas o accidente cerebrovascular. Su mdico tendr un rango objetivo para sus niveles de lpidos en funcin de esta informacin. El tratamiento para la dislipidemia comienza con cambios en el estilo de vida, tales como dieta y ejercicio. El mdico podra recomendarle que haga lo siguiente: Hacer ejercicio con regularidad. Realizar cambios en la dieta. Si fuma, dejar de hacerlo. Limitar el consumo de bebidas alcohlicas. Si los cambios en la dieta y la actividad fsica no ayudan a alcanzar sus objetivos, el mdico tambin puede recetarle medicamentos para disminuir los lpidos. El tipo de medicamento recetado con ms frecuencia disminuye el colesterol LDL (estatinas). Si tiene un nivel alto de triglicridos, su mdico puede recetarle otro tipo de frmaco (fibratos) o un suplemento de aceite de pescado con omega-3, o ambos. Siga estas instrucciones en su casa: Comida y bebida  Siga las indicaciones del mdico o el nutricionista respecto de las restricciones para las comidas o las bebidas. Siga una dieta saludable como se lo haya indicado el mdico. Esto puede ayudarle a alcanzar y mantener un peso saludable, reducir el colesterol LDL y aumentar el colesterol HDL. Puede incluir: Limitar sus caloras, si tiene sobrepeso. Comer ms frutas, verduras, cereales integrales, pescado y carnes magras. Limitar las grasas saturadas, las grasas trans y el colesterol. No beba alcohol si: Su mdico le indica no hacerlo. Est embarazada, puede estar embarazada o est tratando de quedar embarazada. Si bebe alcohol: Limite la cantidad que bebe a lo siguiente: De 0 a 1 medida por da para las mujeres. De 0 a 2 medidas por da para los hombres. Sepa cunta cantidad de alcohol hay en las bebidas que toma. En los [place], una medida equivale a una botella de cerveza de 12 oz (355 ml), un vaso de vino de 5 oz (148 ml) o un vaso de  una bebida alcohlica de alta graduacin de 1 oz (44 ml). Actividad Haga ejercicio con regularidad. Siga un programa de ejercicio y entrenamiento de fuerza tal como se lo haya indicado el mdico. Pregntele al mdico qu actividades son seguras para usted. El mdico puede recomendarle lo siguiente: 30 minutos de actividad aerbica de 4 a 6 das por semana. La caminata a paso ligero es un ejemplo de actividad aerbica. Entrenamiento de fuerza 2 das por semana. Indicaciones generales No consuma ningn producto que contenga nicotina o tabaco. Estos productos incluyen cigarrillos, tabaco para mascar y aparatos de vapeo, como los cigarrillos electrnicos. Si necesita ayuda para dejar de consumir estos productos, consulte al mdico. Use los medicamentos de venta libre y los recetados solamente como se lo haya indicado el mdico. Esto incluye los suplementos. Concurra a todas las visitas de seguimiento. Esto es importante. Comunquese con un mdico si: Tiene dificultad para cumplir con su plan de actividad fsica o su dieta. Le cuesta dejar de fumar o controlar el consumo de alcohol. Resumen Con frecuencia, la dislipidemia implica un nivel alto de colesterol o triglicridos, que son tipos de lpidos. El tratamiento depende del tipo de dislipidemia que usted tenga y sus otros factores de riesgo de enfermedades cardacas o accidente cerebrovascular. El tratamiento para la dislipidemia comienza con cambios en el estilo de vida, tales como dieta y ejercicio. Su mdico puede recetarle medicamentos para disminuir los lpidos. Esta informacin no tiene como fin reemplazar el consejo del mdico. Asegrese de hacerle al mdico cualquier pregunta que tenga. Document Revised: 03/19/2020 Document Reviewed: 03/19/2020 Elsevier Patient   Education  2023 [employer]  

## 2022-04-18 NOTE — Progress Notes (Signed)
Patient ID: Amanda Blanchard, female    DOB: 06/15/1955  MRN: [id number]  CC: Hypothyroidism (Hypothyroidism & chronic conditions f/u. Med refill. )   Subjective: Amanda Blanchard is a 67 y.o. female who presents for chronic disease management Her concerns today include:  HTN, HL, hypothyroidism, COVID pneumonia 02/2020, CKD 3, markedly abnormal LFTs 03/2020 with cirrhotic changes on imaging.  LFTs have subsequently normalized.  CT of the abdomen showed changes consistent with cirrhosis  AMN Language interpreter used during this encounter. #Luis [id number]  Inform pt that we had problems reaching her via phone after last visit to discuss lab results.  She tells me that her direct  phone [phone].  One listed as primary contact # currently is her daughter-in-law  HTN: Reports compliance with Norvasc 5 mg daily and HCTZ 12.5 mg daily. No CP/SOB/LE edema  Hypothyroidism: On last visit we increased levothyroxine to 75 mcg as was previously intended.  TSH was 45.  Pt has bottle with her today.   -reports compliance.  Current bottle has a fill dated of 03/16/2022 for a 1 mth supply.  She still has 3 pills in the bottle.  Then said she had skip about 3 days of the med but not consecutive days.  CKD 3a: Last GFR was 57 which was stable from previous.  Makes good urine.  She is not on Aleve, Advil, Ibuprofen.  HL/Aortic atherosclerosis: Lipid profile on last visit showed LDL of 176 with total cholesterol of 270 and ASCVD of 7.1%.  We had tried reaching her and submitted referral to nutritionist.  They tried calling her unsuccessfully.  Referred to GI on last visit for cirrhosis based on CT scan done back in 2022.  Last LFTs nl.  Pt does not drink.  Looks like GI has not tried to reach her as yet.  If we are unable to get her in with the gastroenterologist, on next visit we can order repeat CT or ultrasound of the abdomen to see whether changes of cirrhosis still persists.  C/o allergy  symptoms including scratchy throat, some itchy throat and eyes, tearing.  No sneezing.  Not taking anything for her symptoms Patient Active Problem List   Diagnosis Date Noted   Unilateral primary osteoarthritis, left hip 02/15/2021   Status post total replacement of left hip 02/15/2021   Aortic atherosclerosis 08/31/2020   Cirrhosis of liver without ascites 08/31/2020   Bunion of great toe of right foot 08/31/2020   Obesity (BMI 30.0-34.9) 08/31/2020   Influenza vaccine refused 04/13/2020   Tetanus, diphtheria, and acellular pertussis (Tdap) vaccination declined 04/13/2020   COVID-19 vaccine dose declined 04/13/2020   Essential hypertension 02/11/2020   Hypothyroidism 02/11/2020   HLD (hyperlipidemia) 02/11/2020     Current Outpatient Medications on File Prior to Visit  Medication Sig Dispense Refill   amLODipine (NORVASC) 5 MG tablet Take 1 tablet (5 mg total) by mouth daily. 30 tablet 6   levothyroxine (SYNTHROID) 75 MCG tablet Take 1 tablet (75 mcg total) by mouth daily. 30 tablet 3   triamcinolone cream (KENALOG) 0.1 % Apply 1 application. topically 2 (two) times daily. 30 g 0   aspirin 81 MG chewable tablet Chew 1 tablet (81 mg total) by mouth 2 (two) times daily. (Patient not taking: Reported on 12/16/2021) 30 tablet 0   diclofenac Sodium (VOLTAREN) 1 % GEL Apply 2 g topically 4 (four) times daily. (Patient not taking: Reported on 04/18/2022) 100 g 0   fluconazole (DIFLUCAN) 150 MG tablet  Take 1 tablet (150 mg total) by mouth daily. (Patient not taking: Reported on 04/18/2022) 1 tablet 0   No current facility-administered medications on file prior to visit.    No Known Allergies  Social History   Socioeconomic History   Marital status: Married    Spouse name: Not on file   Number of children: 2   Years of education: Not on file   Highest education level: Not on file  Occupational History   Not on file  Tobacco Use   Smoking status: Never   Smokeless tobacco: Never   Vaping Use   Vaping Use: Never used  Substance and Sexual Activity   Alcohol use: Not Currently   Drug use: Not Currently   Sexual activity: Not on file  Other Topics Concern   Not on file  Social History Narrative   Not on file   Social Determinants of Health   Financial Resource Strain: Not on file  Food Insecurity: Not on file  Transportation Needs: Not on file  Physical Activity: Not on file  Stress: Not on file  Social Connections: Not on file  Intimate Partner Violence: Not on file    Family History  Problem Relation Age of Onset   Diabetes Mellitus II Neg Hx     Past Surgical History:  Procedure Laterality Date   BILATERAL SALPINGECTOMY     many years ago   TOTAL HIP ARTHROPLASTY Left 02/15/2021   Procedure: Left TOTAL HIP ARTHROPLASTY ANTERIOR APPROACH;  Surgeon: Mcarthur Rossetti, MD;  Location: [facility];  Service: Orthopedics;  Laterality: Left;    ROS: Review of Systems Negative except as stated above  PHYSICAL EXAM: BP 126/78 (BP Location: Left Arm, Patient Position: Sitting, Cuff Size: Normal)   Pulse 65   Temp 97.7 F (36.5 C) (Oral)   Ht 5\' 3"  (1.6 m)   Wt 164 lb (74.4 kg)   SpO2 100%   BMI 29.05 kg/m   Physical Exam  General appearance - alert, well appearing, older Hispanic and in no distress Mental status - normal mood, behavior, speech, dress, motor activity, and thought processes Nose - normal and patent, no erythema, discharge or polyps Mouth - mucous membranes moist, pharynx normal without lesions Neck - supple, no significant adenopathy Chest - clear to auscultation, no wheezes, rales or rhonchi, symmetric air entry Heart - normal rate, regular rhythm, normal S1, S2, no murmurs, rubs, clicks or gallops Extremities - peripheral pulses normal, no pedal edema, no clubbing or cyanosis   The 10-year ASCVD risk score (Arnett DK, et al., 2019) is: 6.7%   Values used to calculate the score:     Age: 68 years     Sex: Female     Is  Non-Hispanic African American: No     Diabetic: No     Tobacco smoker: No     Systolic Blood Pressure: 123XX123 mmHg     Is BP treated: Yes     HDL Cholesterol: 74 mg/dL     Total Cholesterol: 270 mg/dL     Latest Ref Rng & Units 12/16/2021   10:35 AM 06/16/2021    4:35 PM 02/16/2021    7:26 AM  CMP  Glucose 70 - 99 mg/dL 83  100  98   BUN 8 - 27 mg/dL 26  22  17    Creatinine 0.57 - 1.00 mg/dL 1.09  1.12  1.38   Sodium 134 - 144 mmol/L 142  139  139   Potassium 3.5 -  5.2 mmol/L 3.8  3.5  3.6   Chloride 96 - 106 mmol/L 103  98  106   CO2 20 - 29 mmol/L 25  23  27    Calcium 8.7 - 10.3 mg/dL 9.8  10.1  8.6   Total Protein 6.0 - 8.5 g/dL 7.0     Total Bilirubin 0.0 - 1.2 mg/dL 0.6     Alkaline Phos 44 - 121 IU/L 86     AST 0 - 40 IU/L 20     ALT 0 - 32 IU/L 12      Lipid Panel     Component Value Date/Time   CHOL 270 (H) 12/16/2021 1035   TRIG 117 12/16/2021 1035   HDL 74 12/16/2021 1035   CHOLHDL 3.6 12/16/2021 1035   LDLCALC 176 (H) 12/16/2021 1035    CBC    Component Value Date/Time   WBC 9.7 06/16/2021 1635   WBC 7.2 02/16/2021 0726   RBC 4.47 06/16/2021 1635   RBC 3.30 (L) 02/16/2021 0726   HGB 13.6 06/16/2021 1635   HCT 40.5 06/16/2021 1635   PLT 236 06/16/2021 1635   MCV 91 06/16/2021 1635   MCH 30.4 06/16/2021 1635   MCH 29.7 02/16/2021 0726   MCHC 33.6 06/16/2021 1635   MCHC 31.8 02/16/2021 0726   RDW 13.0 06/16/2021 1635   LYMPHSABS 0.8 02/16/2020 0341   MONOABS 0.9 02/16/2020 0341   EOSABS 0.0 02/16/2020 0341   BASOSABS 0.0 02/16/2020 0341    ASSESSMENT AND PLAN: 1. Essential hypertension At goal. Cont HCTZ and Norvasc 7.5 mg - hydrochlorothiazide (MICROZIDE) 12.5 MG capsule; TOME 1 CAPSULA UNA VEZ AL DIA.  Dispense: 90 capsule; Refill: 1  2. Acquired hypothyroidism Encourage compliance. Recheck TSH today. - TSH  3. Stage 3a chronic kidney disease Stable.  We will continue to monitor intermittently.  Advised to avoid NSAIDs.  4. Mixed  hyperlipidemia Dietary counseling given.  Printed dietary information also provided in Spanish. I will resubmit referral to the dietitian. 5. Aortic atherosclerosis Ideally should be on statin but will hold off until liver status is reevaluated  6. Seasonal allergies - loratadine (CLARITIN) 10 MG tablet; Take 1 tablet (10 mg total) by mouth daily.  Dispense: 30 tablet; Refill: 11  7. Cirrhosis of liver without ascites, unspecified hepatic cirrhosis type Message sent to referral coordinator inquiring about referral to GI that was submitted on last visit.  I had patient update her telephone contact information with the front desk before she left today.   Patient was given the opportunity to ask questions.  Patient verbalized understanding of the plan and was able to repeat key elements of the plan.   This documentation was completed using Radio producer.  Any transcriptional errors are unintentional.  Orders Placed This Encounter  Procedures   TSH     Requested Prescriptions   Signed Prescriptions Disp Refills   hydrochlorothiazide (MICROZIDE) 12.5 MG capsule 90 capsule 1    Sig: TOME 1 CAPSULA UNA VEZ AL DIA.   loratadine (CLARITIN) 10 MG tablet 30 tablet 11    Sig: Take 1 tablet (10 mg total) by mouth daily.    Return in about 4 months (around 08/18/2022).  Karle Plumber, MD, FACP

## 2022-04-18 NOTE — Telephone Encounter (Signed)
Requested medications are due for refill today.  yes  Requested medications are on the active medications list.  yes  Last refill. 12/16/2021 #30 3 rf  Future visit scheduled.   yes  Notes to clinic.  Abnormal labs.    Requested Prescriptions  Pending Prescriptions Disp Refills   levothyroxine (SYNTHROID) 75 MCG tablet [Pharmacy Med Name: LEVOTHYROXIN 75MCG] 30 tablet 2    Sig: TOME 1 TABLETA POR LA BOCA AL DIA     Endocrinology:  Hypothyroid Agents Failed - 04/18/2022 11:52 AM      Failed - TSH in normal range and within 360 days    TSH  Date Value Ref Range Status  12/16/2021 45.200 (H) 0.450 - 4.500 uIU/mL Final         Passed - Valid encounter within last 12 months    Recent Outpatient Visits           Today Essential hypertension   [place], MD   4 months ago Acquired hypothyroidism   [place], MD   8 months ago Acute right ankle pain   [place], MD   10 months ago Essential hypertension   [place], MD   1 year ago Hip pain, chronic, left   [place], MD       Future Appointments             In 4 months Wynetta Emery, Dalbert Batman, MD [facility]

## 2022-04-19 ENCOUNTER — Other Ambulatory Visit: Payer: Self-pay | Admitting: Internal Medicine

## 2022-04-19 DIAGNOSIS — E039 Hypothyroidism, unspecified: Secondary | ICD-10-CM

## 2022-04-19 LAB — TSH: TSH: 12.1 u[IU]/mL — ABNORMAL HIGH (ref 0.450–4.500)

## 2022-04-19 MED ORDER — LEVOTHYROXINE SODIUM 100 MCG PO TABS: 100.0000 ug | ORAL_TABLET | Freq: Every day | ORAL | 4 refills | Status: DC

## 2022-04-22 ENCOUNTER — Telehealth: Payer: Self-pay | Admitting: Internal Medicine

## 2022-04-22 NOTE — Telephone Encounter (Signed)
-----   Message from Dionne Bucy sent at 04/21/2022  4:35 PM EDT ----- Regarding: RE: GI referral Good Afternoon   Patient  have   Medicaid [employer]  and is not affiliated with [place]  , I will sent it to [facility] . Urgent     ----- Message ----- From: Marcine Matar, MD Sent: 04/18/2022  12:32 PM EDT To: Dionne Bucy Subject: GI referral                                    I submitted referral to gastroenterology when I saw her in December.  So far it looks like that she has not been contacted with an appointment as yet.  Could you please check into it?

## 2022-05-24 ENCOUNTER — Ambulatory Visit: Payer: Self-pay | Attending: Internal Medicine

## 2022-05-24 DIAGNOSIS — E039 Hypothyroidism, unspecified: Secondary | ICD-10-CM

## 2022-05-25 LAB — TSH: TSH: 1.24 u[IU]/mL (ref 0.450–4.500)

## 2022-07-10 LAB — HM MAMMOGRAPHY: HM Mammogram: NORMAL (ref 0–4)

## 2022-07-12 ENCOUNTER — Telehealth: Payer: Self-pay | Admitting: Internal Medicine

## 2022-07-13 NOTE — Telephone Encounter (Signed)
Called & spoke to the patient. Verified name & DOB. Informed of results. Patient expressed verbal understanding.   Results entered through quick abstracts & health maintenance.

## 2022-08-22 ENCOUNTER — Encounter: Payer: Self-pay | Admitting: Internal Medicine

## 2022-08-22 ENCOUNTER — Ambulatory Visit: Payer: PRIVATE HEALTH INSURANCE | Attending: Internal Medicine | Admitting: Internal Medicine

## 2022-08-22 VITALS — BP 109/67 | HR 63 | Temp 97.8°F | Ht 63.0 in | Wt 162.0 lb

## 2022-08-22 DIAGNOSIS — K7460 Unspecified cirrhosis of liver: Secondary | ICD-10-CM

## 2022-08-22 DIAGNOSIS — I10 Essential (primary) hypertension: Secondary | ICD-10-CM | POA: Diagnosis not present

## 2022-08-22 DIAGNOSIS — E039 Hypothyroidism, unspecified: Secondary | ICD-10-CM | POA: Diagnosis not present

## 2022-08-22 DIAGNOSIS — J302 Other seasonal allergic rhinitis: Secondary | ICD-10-CM

## 2022-08-22 DIAGNOSIS — E782 Mixed hyperlipidemia: Secondary | ICD-10-CM | POA: Diagnosis not present

## 2022-08-22 MED ORDER — LORATADINE 10 MG PO TABS: 10.0000 mg | ORAL_TABLET | Freq: Every day | ORAL | 1 refills | Status: DC

## 2022-08-22 MED ORDER — AMLODIPINE BESYLATE 5 MG PO TABS
5.0000 mg | ORAL_TABLET | Freq: Every day | ORAL | 1 refills | Status: DC
Start: 2022-08-22 — End: 2023-04-27

## 2022-08-22 MED ORDER — HYDROCHLOROTHIAZIDE 12.5 MG PO CAPS: ORAL_CAPSULE | ORAL | 1 refills | Status: DC

## 2022-08-22 MED ORDER — LEVOTHYROXINE SODIUM 100 MCG PO TABS
100.0000 ug | ORAL_TABLET | Freq: Every day | ORAL | 4 refills | Status: DC
Start: 2022-08-22 — End: 2022-08-23

## 2022-08-22 NOTE — Progress Notes (Signed)
Patient ID: Amanda Blanchard, female    DOB: 03-03-1955  MRN: [id number]  CC: Hypertension (HTN & hypothyroidism f/u. Med refills. /Intermittent swelling of R & L feet during long walks)   Subjective: Amanda Blanchard is a 67 y.o. female who presents for chronic disease management Her concerns today include:  HTN, HL, hypothyroidism, COVID pneumonia 02/2020, CKD 3, markedly abnormal LFTs 03/2020 with cirrhotic changes on imaging.  LFTs have subsequently normalized.  CT of the abdomen showed changes consistent with cirrhosis   AMN Language interpreter used during this encounter. #[id number], Noe  Pt has meds with her today.  HTN: Reports compliance with Norvasc 5 mg and HCTZ 12.5 mg daily.  Did not take meds as yet this a.m Checks BP 2-4x/mth.  Gives range 130-140 SBP, no recall on DP No CP/SOB No LE edema at this time.  Some LE edema if she sits for a long time like when traveling to [place].  Not plans as yet to travel to [place] in near future but would like to know what to do to help prevent swelling when she does.   Hypothyroidism: After last visit, TSH was 12 which was improved but still not at goal.  Levothyroxine increased from 75 mcg daily to 100 mcg daily.   Did get a dose increase and is taking consistently.  Cirrhosis on imaging:  Referred to GI 12/2021 for cirrhosis based on CT scan done back in 2022. LFT were elev at time but have since nl. Pt does not drink ETOH beverages and screen for viral hepatitis negative.  GI was never able to reach her to schedule appt.  On last visit.  we had planned to resubmit referral to GI once contact information was updated but I forgot to do so.  She remains asymptomatic.  HL: We had planned to refer her to nutritionist on last visit but forgot to do so. Patient Active Problem List   Diagnosis Date Noted   Unilateral primary osteoarthritis, left hip 02/15/2021   Status post total replacement of left hip 02/15/2021   Aortic  atherosclerosis (HCC) 08/31/2020   Cirrhosis of liver without ascites (HCC) 08/31/2020   Bunion of great toe of right foot 08/31/2020   Obesity (BMI 30.0-34.9) 08/31/2020   Influenza vaccine refused 04/13/2020   Tetanus, diphtheria, and acellular pertussis (Tdap) vaccination declined 04/13/2020   COVID-19 vaccine dose declined 04/13/2020   Essential hypertension 02/11/2020   Hypothyroidism 02/11/2020   HLD (hyperlipidemia) 02/11/2020     No current outpatient medications on file prior to visit.   No current facility-administered medications on file prior to visit.    No Known Allergies  Social History   Socioeconomic History   Marital status: Married    Spouse name: Not on file   Number of children: 2   Years of education: Not on file   Highest education level: Not on file  Occupational History   Not on file  Tobacco Use   Smoking status: Never   Smokeless tobacco: Never  Vaping Use   Vaping status: Never Used  Substance and Sexual Activity   Alcohol use: Not Currently   Drug use: Not Currently   Sexual activity: Not on file  Other Topics Concern   Not on file  Social History Narrative   Not on file   Social Determinants of Health   Financial Resource Strain: Not on file  Food Insecurity: Not on file  Transportation Needs: Not on file  Physical Activity: Not on  file  Stress: Not on file  Social Connections: Not on file  Intimate Partner Violence: Not on file    Family History  Problem Relation Age of Onset   Diabetes Mellitus II Neg Hx     Past Surgical History:  Procedure Laterality Date   BILATERAL SALPINGECTOMY     many years ago   TOTAL HIP ARTHROPLASTY Left 02/15/2021   Procedure: Left TOTAL HIP ARTHROPLASTY ANTERIOR APPROACH;  Surgeon: Kathryne Hitch, MD;  Location: [facility];  Service: Orthopedics;  Laterality: Left;    ROS: Review of Systems Negative except as stated above  PHYSICAL EXAM: BP 109/67 (BP Location: Left Arm, Patient  Position: Sitting, Cuff Size: Normal)   Pulse 63   Temp 97.8 F (36.6 C) (Oral)   Ht 5\' 3"  (1.6 m)   Wt 162 lb (73.5 kg)   SpO2 99%   BMI 28.70 kg/m   Physical Exam  General appearance - alert, well appearing, and in no distress Mental status - normal mood, behavior, speech, dress, motor activity, and thought processes Chest - clear to auscultation, no wheezes, rales or rhonchi, symmetric air entry Heart - normal rate, regular rhythm, normal S1, S2, no murmurs, rubs, clicks or gallops Abdomen - soft, nontender, nondistended, no masses or organomegaly Extremities - peripheral pulses normal, no pedal edema, no clubbing or cyanosis      Latest Ref Rng & Units 12/16/2021   10:35 AM 06/16/2021    4:35 PM 02/16/2021    7:26 AM  CMP  Glucose 70 - 99 mg/dL 83  161  98   BUN 8 - 27 mg/dL 26  22  17    Creatinine 0.57 - 1.00 mg/dL 0.96  0.45  4.09   Sodium 134 - 144 mmol/L 142  139  139   Potassium 3.5 - 5.2 mmol/L 3.8  3.5  3.6   Chloride 96 - 106 mmol/L 103  98  106   CO2 20 - 29 mmol/L 25  23  27    Calcium 8.7 - 10.3 mg/dL 9.8  81.1  8.6   Total Protein 6.0 - 8.5 g/dL 7.0     Total Bilirubin 0.0 - 1.2 mg/dL 0.6     Alkaline Phos 44 - 121 IU/L 86     AST 0 - 40 IU/L 20     ALT 0 - 32 IU/L 12      Lipid Panel     Component Value Date/Time   CHOL 270 (H) 12/16/2021 1035   TRIG 117 12/16/2021 1035   HDL 74 12/16/2021 1035   CHOLHDL 3.6 12/16/2021 1035   LDLCALC 176 (H) 12/16/2021 1035    CBC    Component Value Date/Time   WBC 9.7 06/16/2021 1635   WBC 7.2 02/16/2021 0726   RBC 4.47 06/16/2021 1635   RBC 3.30 (L) 02/16/2021 0726   HGB 13.6 06/16/2021 1635   HCT 40.5 06/16/2021 1635   PLT 236 06/16/2021 1635   MCV 91 06/16/2021 1635   MCH 30.4 06/16/2021 1635   MCH 29.7 02/16/2021 0726   MCHC 33.6 06/16/2021 1635   MCHC 31.8 02/16/2021 0726   RDW 13.0 06/16/2021 1635   LYMPHSABS 0.8 02/16/2020 0341   MONOABS 0.9 02/16/2020 0341   EOSABS 0.0 02/16/2020 0341   BASOSABS  0.0 02/16/2020 0341    ASSESSMENT AND PLAN: 1. Essential hypertension At goal.  Continue amlodipine 5 mg and HCTZ 12.5 mg daily. - hydrochlorothiazide (MICROZIDE) 12.5 MG capsule; TOME 1 CAPSULA UNA VEZ AL DIA.  Dispense: 90 capsule; Refill: 1 - amLODipine (NORVASC) 5 MG tablet; Take 1 tablet (5 mg total) by mouth daily.  Dispense: 90 tablet; Refill: 1 - Comprehensive metabolic panel  2. Acquired hypothyroidism Continue levothyroxine 100 mcg daily.  Recheck TSH today. - TSH - levothyroxine (SYNTHROID) 100 MCG tablet; Take 1 tablet (100 mcg total) by mouth daily.  Dispense: 30 tablet; Refill: 4  3. Mixed hyperlipidemia Patient agreeable to referral to nutritionist. - Amb ref to Medical Nutrition Therapy-MNT  4. Cirrhosis of liver without ascites, unspecified hepatic cirrhosis type (HCC) I have resubmitted referral to the gastroenterologist.  We will also get an updated liver ultrasound. - Ambulatory referral to Gastroenterology - US Abdomen Limited RUQ (LIVER/GB); Future  5. Seasonal allergies Patient requested refill on Claritin for seasonal allergies. - loratadine (CLARITIN) 10 MG tablet; Take 1 tablet (10 mg total) by mouth daily.  Dispense: 90 tablet; Refill: 1      Patient was given the opportunity to ask questions.  Patient verbalized understanding of the plan and was able to repeat key elements of the plan.   This documentation was completed using Paediatric nurse.  Any transcriptional errors are unintentional.  Orders Placed This Encounter  Procedures   US Abdomen Limited RUQ (LIVER/GB)   TSH   Comprehensive metabolic panel   Amb ref to Medical Nutrition Therapy-MNT   Ambulatory referral to Gastroenterology     Requested Prescriptions   Signed Prescriptions Disp Refills   hydrochlorothiazide (MICROZIDE) 12.5 MG capsule 90 capsule 1    Sig: TOME 1 CAPSULA UNA VEZ AL DIA.   loratadine (CLARITIN) 10 MG tablet 90 tablet 1    Sig: Take 1 tablet  (10 mg total) by mouth daily.   amLODipine (NORVASC) 5 MG tablet 90 tablet 1    Sig: Take 1 tablet (5 mg total) by mouth daily.   levothyroxine (SYNTHROID) 100 MCG tablet 30 tablet 4    Sig: Take 1 tablet (100 mcg total) by mouth daily.    Return in about 4 months (around 12/22/2022).  Jonah Blue, MD, FACP

## 2022-08-23 ENCOUNTER — Other Ambulatory Visit: Payer: Self-pay | Admitting: Internal Medicine

## 2022-08-23 DIAGNOSIS — E039 Hypothyroidism, unspecified: Secondary | ICD-10-CM

## 2022-08-23 MED ORDER — LEVOTHYROXINE SODIUM 75 MCG PO TABS: 75.0000 ug | ORAL_TABLET | Freq: Every day | ORAL | 5 refills | Status: DC

## 2022-08-25 ENCOUNTER — Ambulatory Visit
Admission: RE | Admit: 2022-08-25 | Discharge: 2022-08-25 | Disposition: A | Discharge status: Home / Self Care | Payer: PRIVATE HEALTH INSURANCE | Source: Ambulatory Visit | Attending: Internal Medicine | Admitting: Internal Medicine

## 2022-08-25 DIAGNOSIS — K7460 Unspecified cirrhosis of liver: Secondary | ICD-10-CM

## 2022-10-09 ENCOUNTER — Ambulatory Visit: Payer: PRIVATE HEALTH INSURANCE | Attending: Internal Medicine

## 2022-10-09 ENCOUNTER — Other Ambulatory Visit: Payer: PRIVATE HEALTH INSURANCE

## 2022-10-09 DIAGNOSIS — E039 Hypothyroidism, unspecified: Secondary | ICD-10-CM

## 2022-10-10 ENCOUNTER — Other Ambulatory Visit: Payer: Self-pay | Admitting: Internal Medicine

## 2022-10-10 DIAGNOSIS — E039 Hypothyroidism, unspecified: Secondary | ICD-10-CM

## 2022-10-10 LAB — TSH: TSH: 4.93 u[IU]/mL — ABNORMAL HIGH (ref 0.450–4.500)

## 2022-10-25 IMAGING — DX DG PORTABLE PELVIS
1 series · 1 of 1 positions shown · non-contrast
Comparison: None.

CLINICAL DATA: Post left hip replacement

EXAM:
PORTABLE PELVIS 1-2 VIEWS

[pelvis ap]
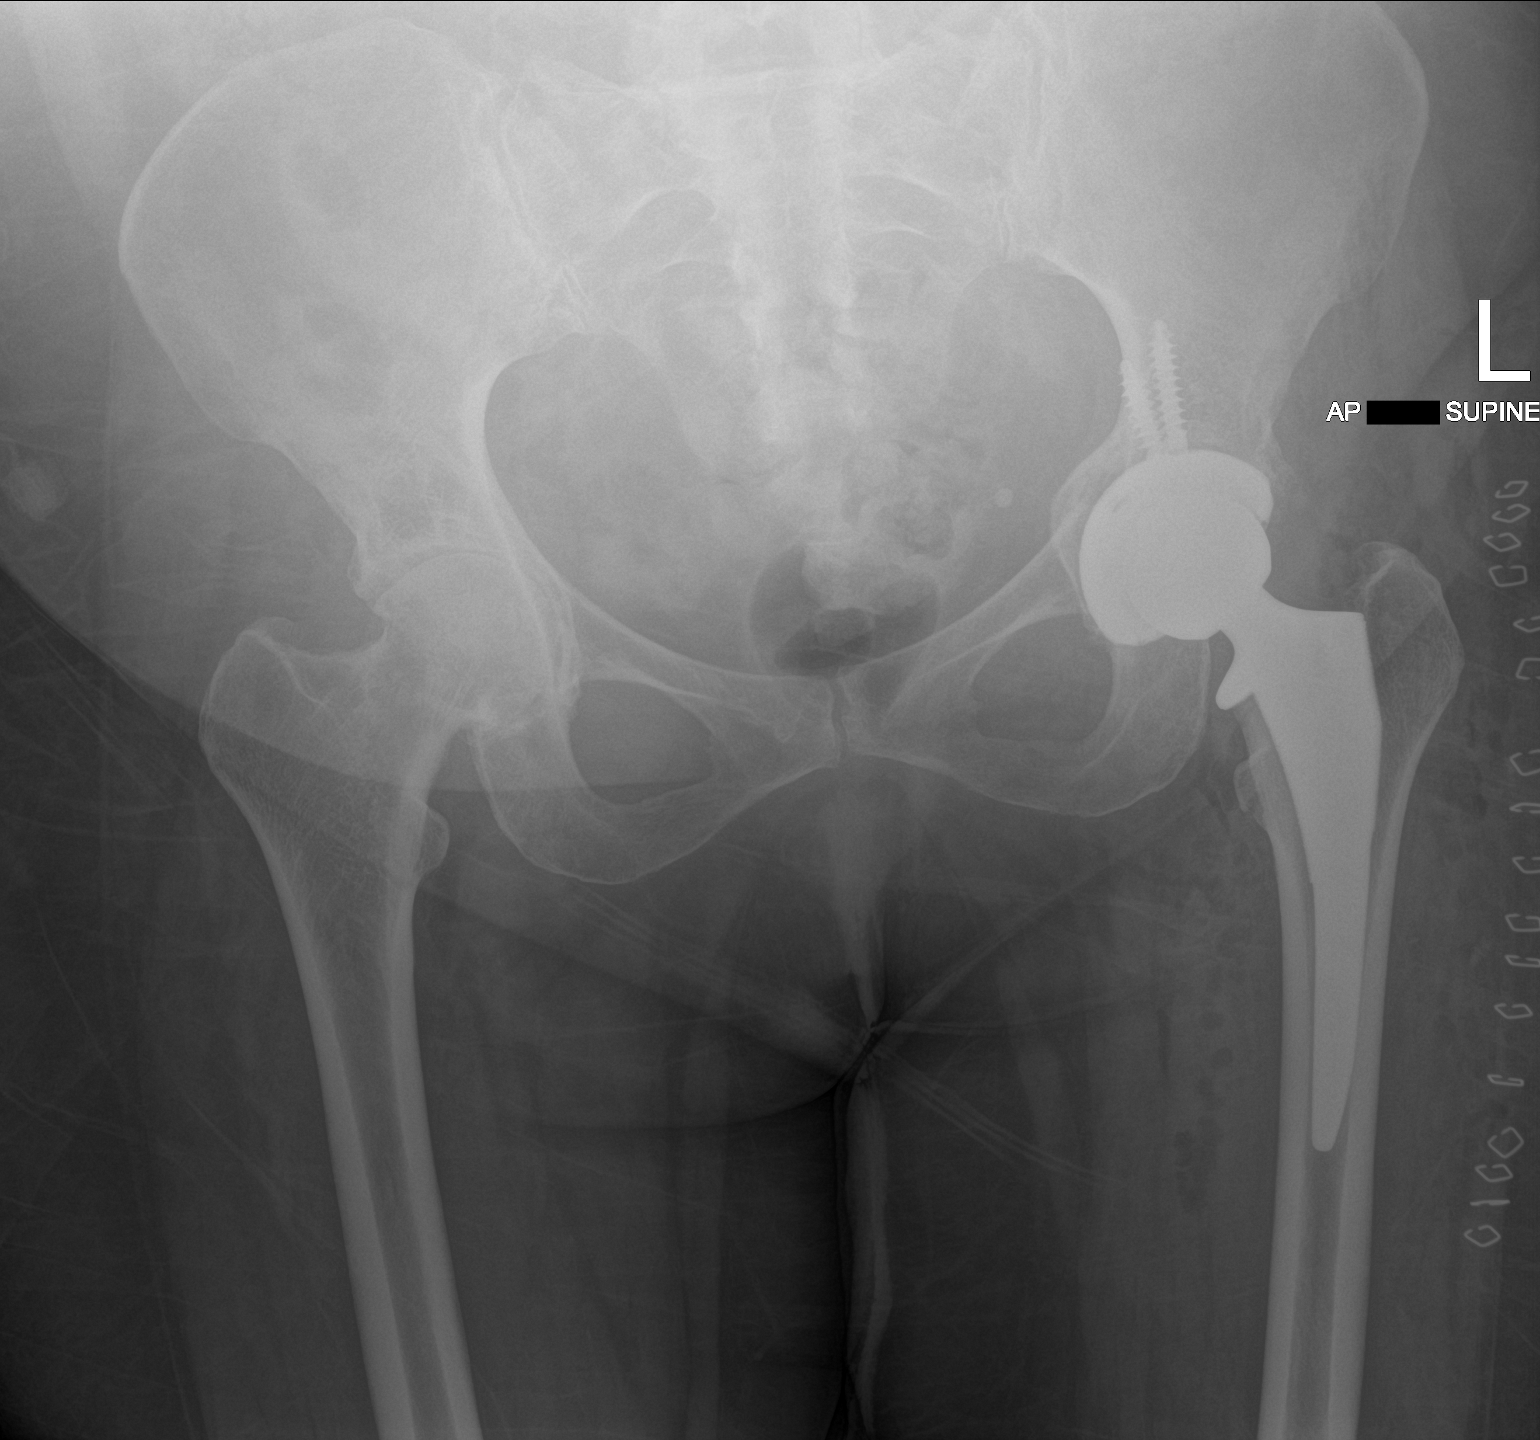

[1 of 1 positions shown; findings below may reference images not displayed]

FINDINGS: Left total hip arthroplasty with 2 acetabular screws. No
complication on this image. Postoperative soft tissue air is noted.
IMPRESSION: Post left total hip arthroplasty

## 2022-10-25 IMAGING — RF DG HIP (WITH OR WITHOUT PELVIS) 1V*L*
1 series · 5 of 5 positions shown · non-contrast
Comparison: None.

CLINICAL DATA: Left total hip replacement

EXAM:
DG HIP (WITH OR WITHOUT PELVIS) 1V*L*

[Series 1: unknown protocol · 0.20mm/px · 5 of 5 slices shown]
[im 1/5]
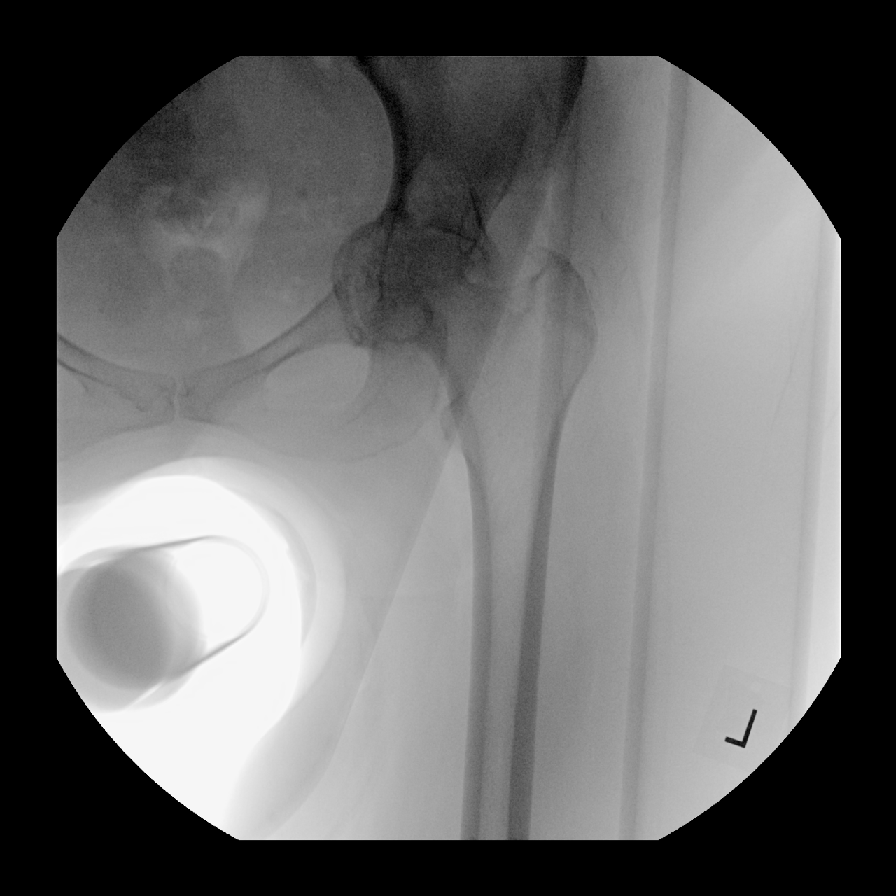
[im 2/5]
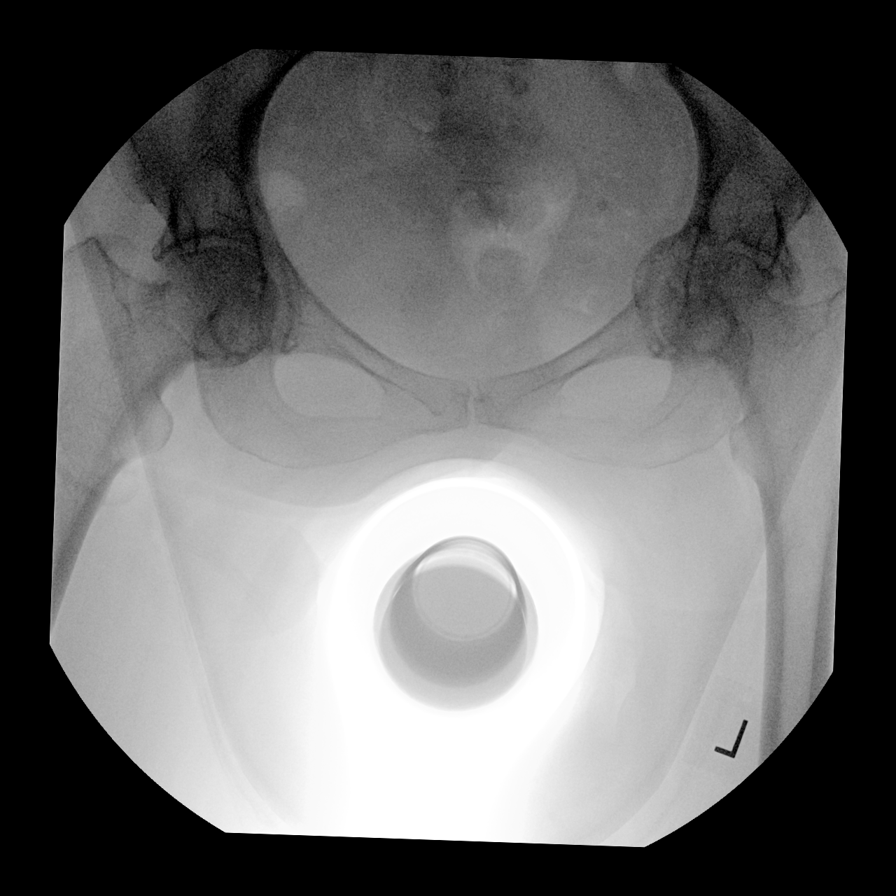
[im 3/5]
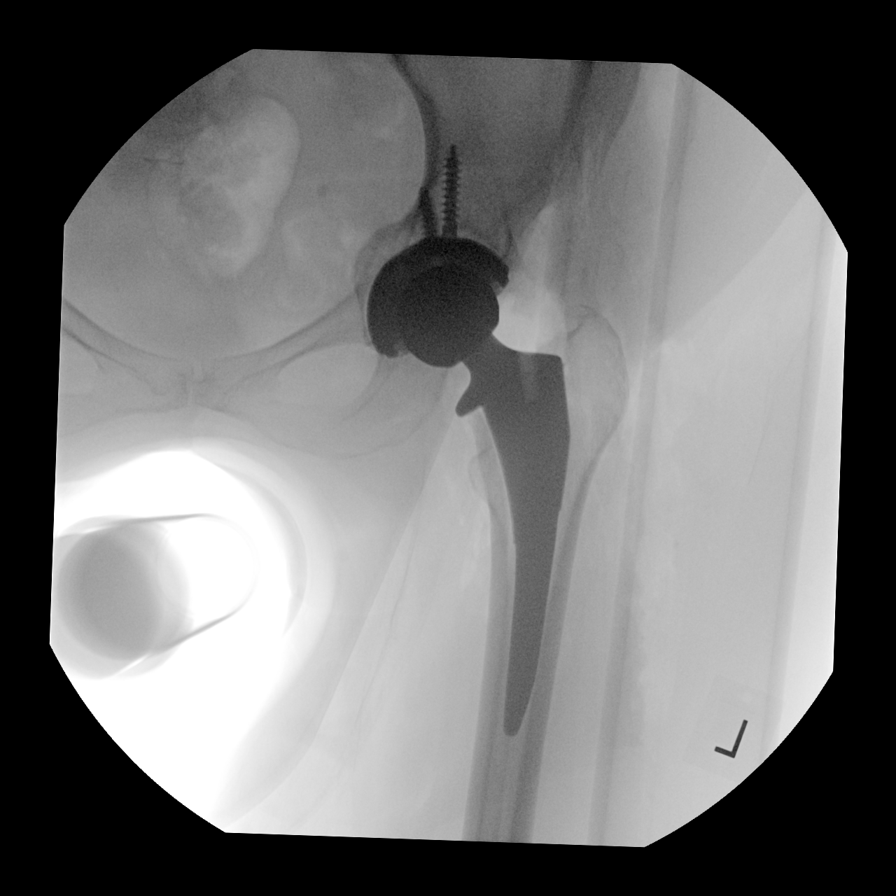
[im 4/5]
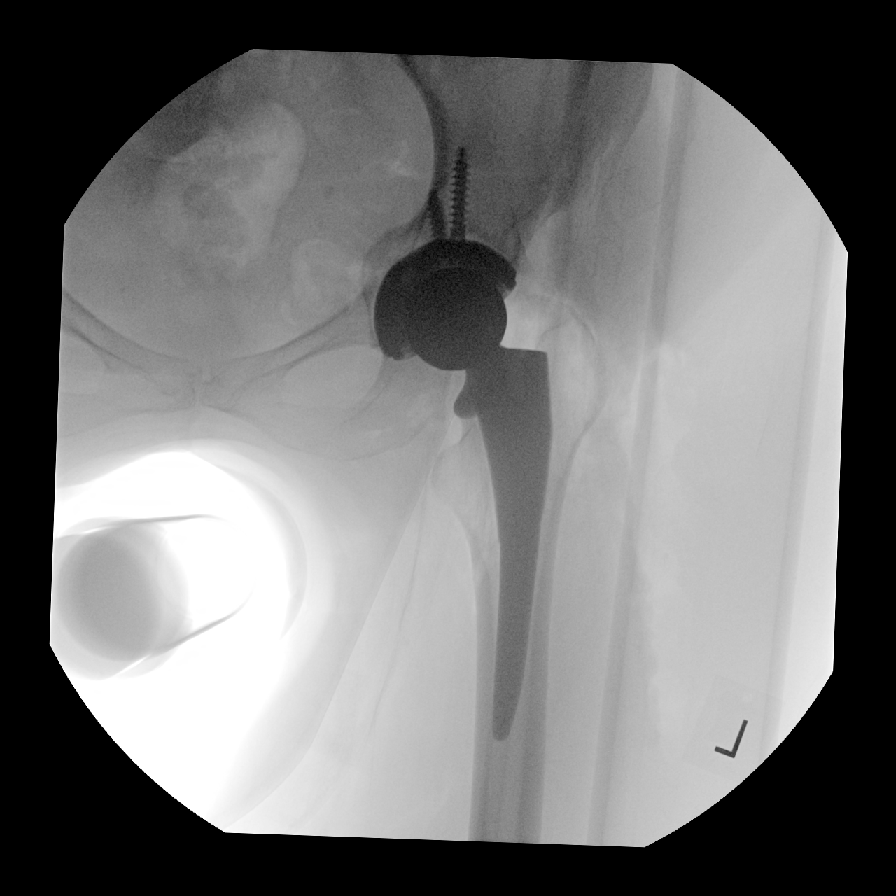
[im 5/5]
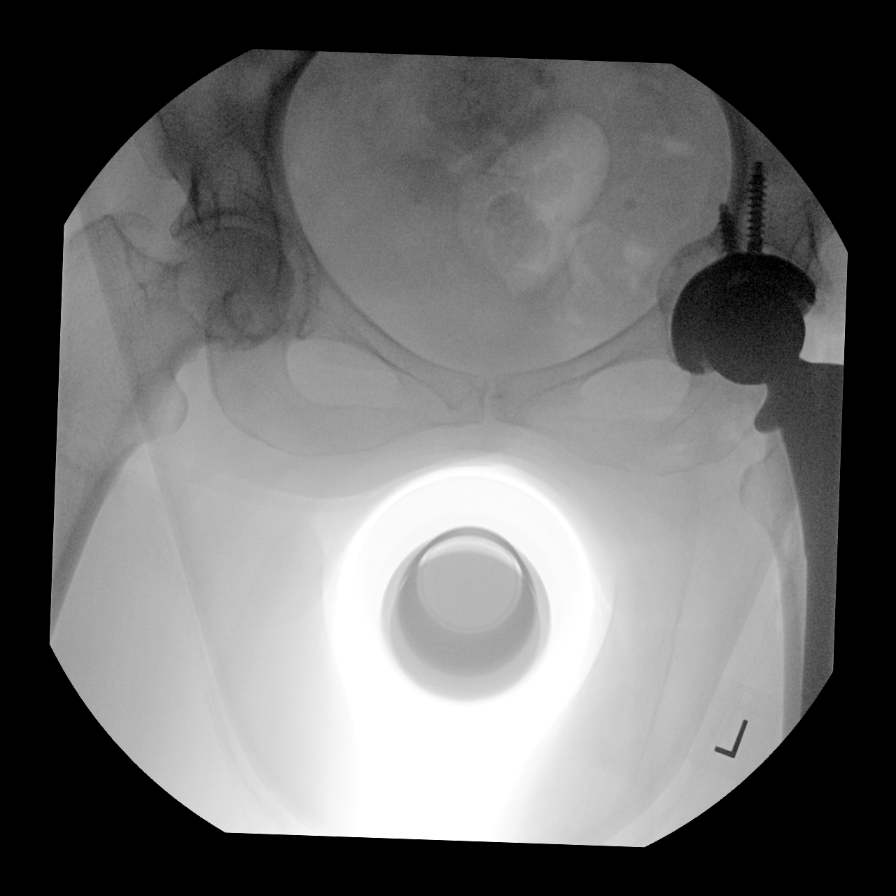

[5 of 5 positions shown; findings below may reference images not displayed]

FINDINGS: A series of fluoroscopic spot images document initially advanced
left hip DJD, and subsequent changes of left hip arthroplasty,
components projecting in expected location without fracture or
dislocation.
IMPRESSION: Left hip arthroplasty without apparent complication.

## 2022-11-10 ENCOUNTER — Ambulatory Visit: Payer: No Typology Code available for payment source | Attending: Nurse Practitioner

## 2022-11-10 DIAGNOSIS — E039 Hypothyroidism, unspecified: Secondary | ICD-10-CM

## 2022-11-11 LAB — TSH: TSH: 7.04 u[IU]/mL — ABNORMAL HIGH (ref 0.450–4.500)

## 2022-11-11 NOTE — Progress Notes (Signed)
Let patient know that her thyroid level is still not at goal.  Please confirm that she has been taking the levothyroxine 75 mcg every day consistently.  If she has been doing so, we need to increase the dose to 88 mcg daily.  Please let me know her response so I know whether to send the prescription for the increased dose.  If we do have to increase the dose, she should return to the lab for recheck after being on the increased dose for 6 weeks.

## 2022-11-21 ENCOUNTER — Other Ambulatory Visit: Payer: Self-pay | Admitting: Internal Medicine

## 2022-11-21 DIAGNOSIS — E039 Hypothyroidism, unspecified: Secondary | ICD-10-CM

## 2022-11-21 MED ORDER — LEVOTHYROXINE SODIUM 88 MCG PO TABS
88.0000 ug | ORAL_TABLET | Freq: Every day | ORAL | 4 refills | Status: DC
Start: 2022-11-21 — End: 2022-12-27

## 2022-11-24 ENCOUNTER — Encounter: Payer: No Typology Code available for payment source | Admitting: Dietician

## 2022-11-27 ENCOUNTER — Encounter: Payer: No Typology Code available for payment source | Attending: Internal Medicine | Admitting: Dietician

## 2022-11-27 DIAGNOSIS — Z713 Dietary counseling and surveillance: Secondary | ICD-10-CM | POA: Diagnosis not present

## 2022-11-27 DIAGNOSIS — E785 Hyperlipidemia, unspecified: Secondary | ICD-10-CM

## 2022-11-27 DIAGNOSIS — E782 Mixed hyperlipidemia: Secondary | ICD-10-CM | POA: Diagnosis present

## 2022-11-27 NOTE — Progress Notes (Signed)
Medical Nutrition Therapy  Appointment Start time:  1640  Appointment End time:  1742  Odetta Pink, Spanish Interpreter, CAP   Primary concerns today: none  Referral diagnosis: Mixed hyperlipidemia  Preferred learning style:  no preference indicated Learning readiness:  ready, change in progress)   NUTRITION ASSESSMENT  Clinical Medical Hx:  Past Medical History:  Diagnosis Date   Arthritis    osteoarthritis in left hip   Hypercholesteremia    Hypertension    Thyroid disease     Medications:  Current Outpatient Medications:    amLODipine (NORVASC) 5 MG tablet, Take 1 tablet (5 mg total) by mouth daily., Disp: 90 tablet, Rfl: 1   hydrochlorothiazide (MICROZIDE) 12.5 MG capsule, TOME 1 CAPSULA UNA VEZ AL DIA., Disp: 90 capsule, Rfl: 1   levothyroxine (SYNTHROID) 88 MCG tablet, Take 1 tablet (88 mcg total) by mouth daily. STOP the 100 mcg dose., Disp: 30 tablet, Rfl: 4   loratadine (CLARITIN) 10 MG tablet, Take 1 tablet (10 mg total) by mouth daily. (Patient not taking: Reported on 11/27/2022), Disp: 90 tablet, Rfl: 1  Labs:  Lab Results  Component Value Date   CHOL 270 (H) 12/16/2021   HDL 74 12/16/2021   LDLCALC 176 (H) 12/16/2021   TRIG 117 12/16/2021   CHOLHDL 3.6 12/16/2021   Lab Results  Component Value Date   TSH 7.040 (H) 11/10/2022   No results found for: "HGBA1C"  Lifestyle & Dietary Hx Pt present today with CAP interpreter. Pt reports she avoids adding salt and does not add sugar to her foods. Pt reports she procures and prepares meals for her family including husband, daughter and 3 children. 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  Pt reports typical intake of 2 meals and states acid reflux with late intake of meals prior to bed.Pt reports limiting red meat and selects animal based proteins once weekly. Pt reports she does select cheese every other day.   Estimated daily fluid intake: 16.9 *4  oz Supplements: none Sleep: ~ 8  hours nightly  Stress / self-care: 0 out of 10 Current average weekly physical activity: walks daily for ~25 minutes    24-Hr Dietary Recall First Meal: ~ 9 am coffee with 2% milk, 1 eggs prepared in corn oil, beans Snack: none Second Meal: ~3 pm: lentils, 2-3 home made corn tortillas Snack: ~ 5 pm: apple or banana, 2% Third Meal: skips Snack: none Beverages: water, coffee with 2%  NUTRITION DIAGNOSIS  NB-1.1 Food and nutrition-related knowledge deficit As related to no prior nutrition related education .  As evidenced by Pt's report and dietary recall.   NUTRITION INTERVENTION  Nutrition education (E-1) on the following topics:  Fruits & Vegetables: Aim to fill half your plate with a variety of fruits and vegetables. They are rich in vitamins, minerals, and fiber, and can help reduce the risk of chronic diseases. Choose a colorful assortment of fruits and vegetables to ensure you get a wide range of nutrients. Grains and Starches: Make at least half of your grain choices whole grains, such as brown rice, whole wheat bread, and oats. Whole grains provide fiber, which aids in digestion and healthy cholesterol levels. Aim for whole forms of starchy vegetables such as potatoes, sweet potatoes, beans, peas, and corn, which are fiber rich and provide many vitamins and minerals.  Protein: Incorporate lean sources of protein, such as poultry, fish, beans, nuts, and seeds, into your meals. Protein is essential for  building and repairing tissues, staying full, balancing blood sugar, as well as supporting immune function. Dairy: Include low-fat or fat-free dairy products like milk, yogurt, and cheese in your diet. Dairy foods are excellent sources of calcium and vitamin D, which are crucial for bone health.  Physical Activity: Aim for 60 minutes of physical activity daily. Regular physical activity promotes overall health-including helping to reduce risk for heart disease and diabetes, promoting mental  health, and helping [place] sleep better.    Handouts Provided Include  Spanish My Plate Heart Healthy Label Reading- Spanish  Building a Healthy Plate In Spanish  Learning Style & Readiness for Change Teaching method utilized: Visual & Auditory  Demonstrated degree of understanding via: Teach Back  Barriers to learning/adherence to lifestyle change: none  Goals Established by Pt Use nutrition labels to select foods lower in saturated fat   MONITORING & EVALUATION Dietary intake, weekly physical activity  Next Steps  Patient is to return PRN.

## 2022-12-26 ENCOUNTER — Ambulatory Visit: Payer: Medicare Other | Attending: Internal Medicine | Admitting: Internal Medicine

## 2022-12-26 ENCOUNTER — Encounter: Payer: Self-pay | Admitting: Internal Medicine

## 2022-12-26 VITALS — BP 118/72 | HR 62 | Temp 97.8°F | Ht 63.0 in | Wt 158.0 lb

## 2022-12-26 DIAGNOSIS — Z8616 Personal history of COVID-19: Secondary | ICD-10-CM | POA: Insufficient documentation

## 2022-12-26 DIAGNOSIS — Z79890 Hormone replacement therapy (postmenopausal): Secondary | ICD-10-CM | POA: Diagnosis not present

## 2022-12-26 DIAGNOSIS — Z79899 Other long term (current) drug therapy: Secondary | ICD-10-CM | POA: Insufficient documentation

## 2022-12-26 DIAGNOSIS — I10 Essential (primary) hypertension: Secondary | ICD-10-CM

## 2022-12-26 DIAGNOSIS — N183 Chronic kidney disease, stage 3 unspecified: Secondary | ICD-10-CM | POA: Diagnosis not present

## 2022-12-26 DIAGNOSIS — E782 Mixed hyperlipidemia: Secondary | ICD-10-CM | POA: Diagnosis not present

## 2022-12-26 DIAGNOSIS — I129 Hypertensive chronic kidney disease with stage 1 through stage 4 chronic kidney disease, or unspecified chronic kidney disease: Secondary | ICD-10-CM | POA: Diagnosis present

## 2022-12-26 DIAGNOSIS — K7460 Unspecified cirrhosis of liver: Secondary | ICD-10-CM | POA: Diagnosis not present

## 2022-12-26 DIAGNOSIS — E039 Hypothyroidism, unspecified: Secondary | ICD-10-CM | POA: Diagnosis not present

## 2022-12-26 NOTE — Progress Notes (Signed)
Patient ID: Amanda Blanchard, female    DOB: 26-Aug-1955  MRN: [id number]  CC: Hypertension (HTN & hypothyroidism f/u. )   Subjective: Amanda Blanchard is a 67 y.o. female who presents for chronic ds management. Her concerns today include:  HTN, HL, hypothyroidism, COVID pneumonia 02/2020, CKD2- 3, markedly abnormal LFTs 03/2020 with cirrhotic changes on imaging.  LFTs have subsequently normalized.  CT of the abdomen showed changes consistent with cirrhosis   [employer]N Language interpreter used during this encounter. #[id number], Allison Quarry  Pt has meds with her.  HTN:  compliant with Norvasc 5 mg and HCTZ 12.5 mg daily and took already this a.m. Checks BP Q 2 wks.  Gives range 120s/70s No CP/SOB/LE edema  Thyroid: we had changed dose of Levothyroxine from 75 mcg to 88 mcg based on last TSH level of 7.0 end of October .  However, she still has the 75 mcg bottle with her today and taking that.    Cirrhosis:  US done on last visit firmed cirrhosis changes in the liver again.  She remains asymptomatic.  Never a drinker.  Pt was referred to GI and [facility] tried calling and left VMM but pt did not call back.    HL:  did see the nutritionist last mth for dietary counseling for the HL.  Found it helpful  Patient Active Problem List   Diagnosis Date Noted   Unilateral primary osteoarthritis, left hip 02/15/2021   Status post total replacement of left hip 02/15/2021   Aortic atherosclerosis (HCC) 08/31/2020   Cirrhosis of liver without ascites (HCC) 08/31/2020   Bunion of great toe of right foot 08/31/2020   Obesity (BMI 30.0-34.9) 08/31/2020   Influenza vaccine refused 04/13/2020   Tetanus, diphtheria, and acellular pertussis (Tdap) vaccination declined 04/13/2020   COVID-19 vaccine dose declined 04/13/2020   Essential hypertension 02/11/2020   Hypothyroidism 02/11/2020   HLD (hyperlipidemia) 02/11/2020     Current Outpatient Medications on File Prior to Visit  Medication Sig  Dispense Refill   amLODipine (NORVASC) 5 MG tablet Take 1 tablet (5 mg total) by mouth daily. 90 tablet 1   hydrochlorothiazide (MICROZIDE) 12.5 MG capsule TOME 1 CAPSULA UNA VEZ AL DIA. 90 capsule 1   levothyroxine (SYNTHROID) 88 MCG tablet Take 1 tablet (88 mcg total) by mouth daily. STOP the 100 mcg dose. 30 tablet 4   loratadine (CLARITIN) 10 MG tablet Take 1 tablet (10 mg total) by mouth daily. (Patient not taking: Reported on 11/27/2022) 90 tablet 1   No current facility-administered medications on file prior to visit.    No Known Allergies  Social History   Socioeconomic History   Marital status: Married    Spouse name: Not on file   Number of children: 2   Years of education: Not on file   Highest education level: Not on file  Occupational History   Not on file  Tobacco Use   Smoking status: Never   Smokeless tobacco: Never  Vaping Use   Vaping status: Never Used  Substance and Sexual Activity   Alcohol use: Not Currently   Drug use: Not Currently   Sexual activity: Not on file  Other Topics Concern   Not on file  Social History Narrative   Not on file   Social Determinants of Health   Financial Resource Strain: Not on file  Food Insecurity: Not on file  Transportation Needs: Not on file  Physical Activity: Not on file  Stress: Not on file  Social Connections: Not on file  Intimate Partner Violence: Not on file    Family History  Problem Relation Age of Onset   Diabetes Mellitus II Neg Hx     Past Surgical History:  Procedure Laterality Date   BILATERAL SALPINGECTOMY     many years ago   TOTAL HIP ARTHROPLASTY Left 02/15/2021   Procedure: Left TOTAL HIP ARTHROPLASTY ANTERIOR APPROACH;  Surgeon: Kathryne Hitch, MD;  Location: [facility] OR;  Service: Orthopedics;  Laterality: Left;    ROS: Review of Systems Negative except as stated above  PHYSICAL EXAM: BP 118/72 (BP Location: Left Arm, Patient Position: Sitting, Cuff Size: Normal)   Pulse 62    Temp 97.8 F (36.6 C) (Oral)   Ht 5\' 3"  (1.6 m)   Wt 158 lb (71.7 kg)   SpO2 92%   BMI 27.99 kg/m   Physical Exam  General appearance - alert, well appearing, and in no distress Mental status - normal mood, behavior, speech, dress, motor activity, and thought processes Neck - supple, no significant adenopathy Chest - clear to auscultation, no wheezes, rales or rhonchi, symmetric air entry Heart - normal rate, regular rhythm, normal S1, S2, no murmurs, rubs, clicks or gallops Abdomen - soft, nontender, nondistended, no masses or organomegaly Extremities - peripheral pulses normal, no pedal edema, no clubbing or cyanosis      Latest Ref Rng & Units 08/22/2022    9:32 AM 12/16/2021   10:35 AM 06/16/2021    4:35 PM  CMP  Glucose 70 - 99 mg/dL 89  83  272   BUN 8 - 27 mg/dL 19  26  22    Creatinine 0.57 - 1.00 mg/dL 5.36  6.44  0.34   Sodium 134 - 144 mmol/L 146  142  139   Potassium 3.5 - 5.2 mmol/L 3.7  3.8  3.5   Chloride 96 - 106 mmol/L 105  103  98   CO2 20 - 29 mmol/L 26  25  23    Calcium 8.7 - 10.3 mg/dL 74.2  9.8  59.5   Total Protein 6.0 - 8.5 g/dL 6.9  7.0    Total Bilirubin 0.0 - 1.2 mg/dL 0.6  0.6    Alkaline Phos 44 - 121 IU/L 105  86    AST 0 - 40 IU/L 20  20    ALT 0 - 32 IU/L 12  12     Lipid Panel     Component Value Date/Time   CHOL 270 (H) 12/16/2021 1035   TRIG 117 12/16/2021 1035   HDL 74 12/16/2021 1035   CHOLHDL 3.6 12/16/2021 1035   LDLCALC 176 (H) 12/16/2021 1035    CBC    Component Value Date/Time   WBC 9.7 06/16/2021 1635   WBC 7.2 02/16/2021 0726   RBC 4.47 06/16/2021 1635   RBC 3.30 (L) 02/16/2021 0726   HGB 13.6 06/16/2021 1635   HCT 40.5 06/16/2021 1635   PLT 236 06/16/2021 1635   MCV 91 06/16/2021 1635   MCH 30.4 06/16/2021 1635   MCH 29.7 02/16/2021 0726   MCHC 33.6 06/16/2021 1635   MCHC 31.8 02/16/2021 0726   RDW 13.0 06/16/2021 1635   LYMPHSABS 0.8 02/16/2020 0341   MONOABS 0.9 02/16/2020 0341   EOSABS 0.0 02/16/2020 0341    BASOSABS 0.0 02/16/2020 0341    ASSESSMENT AND PLAN: 1. Essential hypertension At goal.  Continue amlodipine 5 mg daily and HCTZ 12.5 mg daily. - CBC  2. Acquired hypothyroidism Patient was  supposed to be on levothyroxine 88 mcg but is still taking the 75 mcg.  Advised her to continue this current dose and we will check TSH today.  Will get back to her once we have the results.  If we still need to increase the dose at that time to 88 mcg, we will call her pharmacy to make sure refills on the 75 mcg are canceled. - TSH  3. Cirrhosis of liver without ascites, unspecified hepatic cirrhosis type (HCC) I gave the patient [facility] information on the discharge summary along with the phone number.  Her children speak [place].  She states she will have her son call to get the appointment scheduled.  4. Mixed hyperlipidemia Encouraged her to continue trying to eat healthy.  She has met with the nutritionist already and has found it helpful.    Patient was given the opportunity to ask questions.  Patient verbalized understanding of the plan and was able to repeat key elements of the plan.   This documentation was completed using Paediatric nurse.  Any transcriptional errors are unintentional.  Orders Placed This Encounter  Procedures   TSH   CBC     Requested Prescriptions    No prescriptions requested or ordered in this encounter    Return in about 4 months (around 04/26/2023).  Jonah Blue, MD, FACP

## 2022-12-26 NOTE — Patient Instructions (Addendum)
Please have your daughter/son call [facility] ([address] [place], [place] [address] PH# [phone]) to schedule appointment for cirrhosis of the liver.  They tried to reach you to schedule the appointment to see the gastroenterologist for the diagnosis of cirrhosis.

## 2022-12-27 ENCOUNTER — Other Ambulatory Visit: Payer: Self-pay | Admitting: Internal Medicine

## 2022-12-27 ENCOUNTER — Telehealth: Payer: Self-pay | Admitting: Internal Medicine

## 2022-12-27 DIAGNOSIS — E039 Hypothyroidism, unspecified: Secondary | ICD-10-CM

## 2022-12-27 LAB — CBC
Hematocrit: 42.4 % (ref 34.0–46.6)
Hemoglobin: 14 g/dL (ref 11.1–15.9)
MCH: 31 pg (ref 26.6–33.0)
MCHC: 33 g/dL (ref 31.5–35.7)
MCV: 94 fL (ref 79–97)
Platelets: 282 10*3/uL (ref 150–450)
RBC: 4.51 x10E6/uL (ref 3.77–5.28)
RDW: 13.1 % (ref 11.7–15.4)
WBC: 7.4 10*3/uL (ref 3.4–10.8)

## 2022-12-27 LAB — TSH: TSH: 3.57 u[IU]/mL (ref 0.450–4.500)

## 2022-12-27 MED ORDER — LEVOTHYROXINE SODIUM 75 MCG PO TABS: 75.0000 ug | ORAL_TABLET | Freq: Every day | ORAL | 1 refills | Status: DC

## 2022-12-27 MED ORDER — LEVOTHYROXINE SODIUM 75 MCG PO TABS
75.0000 ug | ORAL_TABLET | Freq: Every day | ORAL | 3 refills | Status: DC
Start: 2022-12-27 — End: 2023-04-27

## 2022-12-27 MED ORDER — LEVOTHYROXINE SODIUM 88 MCG PO TABS
88.0000 ug | ORAL_TABLET | Freq: Every day | ORAL | 3 refills | Status: DC
Start: 2022-12-27 — End: 2022-12-27

## 2022-12-27 NOTE — Telephone Encounter (Signed)
Thyroid level is normal.  Prior to this last visit, I had increased the Levothyroxine from 75 mcg to 88 mcg.  However, when I saw pt yesterday, she had the 75 mcg bottle with her and told me this is the dose she is currently taking.   I called her pharmacy this a.m and spoke with the pharmacist.  She reports pt picked up the Levothyroxine 88 mcg bottle on 11/21/22 and refill again 12/15/22.  She thinks pt may have grabbed an old  bottle of the 75 mcg that she still had at home and brought it with her yesterday to her appt with me.  Please call pt and ask that she verify the dose on her Levothyroxine bottle that she picked up 12/15/22 and whether or not she is taking the 88 mcg or the 75 mcg.

## 2022-12-27 NOTE — Telephone Encounter (Signed)
Called & spoke to the patient. Verified name & DOB. Called to clarify which dose patient has been taking. Patient stated "I was previously taking the but then I was informed that I needed to take the because the 88 mcg was too high of a dose. The most recent prescription that I picked up was a 75 mcg and have consistently been taking that dosage. I still have a good amount of the 88 mcg tablets left and can switch to a higher dose if that is what is recommended." Please advise.

## 2022-12-27 NOTE — Telephone Encounter (Signed)
Okay.  Tell her to stay on the 75 mcg levothyroxine 1 tablet daily.  Please call her pharmacy and cancel all refills on levothyroxine 88 mcg.  Updated prescription sent for the 75 mcg.

## 2022-12-27 NOTE — Telephone Encounter (Signed)
Called & spoke to the patient. Verified name & DOB. Informed patient to continue taking 75 mcg levothyroxine 1 tablet daily. Informed patient to disregard the 88 mcg. Patient expressed verbal understanding of all discussed.   Called [facility] and spoke with Amy. Informed Amy that per Dr.Johnson, all refills on levothyroxine 88 mcg need to be cancelled. Amy expressed verbal understanding and confirmed that the refills will be cancelled. No further assistance required at this time.

## 2023-01-05 ENCOUNTER — Ambulatory Visit: Payer: Medicare Other

## 2023-04-27 ENCOUNTER — Ambulatory Visit: Payer: No Typology Code available for payment source | Attending: Internal Medicine | Admitting: Internal Medicine

## 2023-04-27 ENCOUNTER — Encounter: Payer: Self-pay | Admitting: Internal Medicine

## 2023-04-27 VITALS — BP 103/64 | HR 58 | Ht 63.0 in | Wt 149.0 lb

## 2023-04-27 DIAGNOSIS — Z1211 Encounter for screening for malignant neoplasm of colon: Secondary | ICD-10-CM

## 2023-04-27 DIAGNOSIS — N1831 Chronic kidney disease, stage 3a: Secondary | ICD-10-CM | POA: Diagnosis not present

## 2023-04-27 DIAGNOSIS — E785 Hyperlipidemia, unspecified: Secondary | ICD-10-CM | POA: Insufficient documentation

## 2023-04-27 DIAGNOSIS — I129 Hypertensive chronic kidney disease with stage 1 through stage 4 chronic kidney disease, or unspecified chronic kidney disease: Secondary | ICD-10-CM | POA: Diagnosis not present

## 2023-04-27 DIAGNOSIS — K7460 Unspecified cirrhosis of liver: Secondary | ICD-10-CM

## 2023-04-27 DIAGNOSIS — E039 Hypothyroidism, unspecified: Secondary | ICD-10-CM

## 2023-04-27 DIAGNOSIS — Z79890 Hormone replacement therapy (postmenopausal): Secondary | ICD-10-CM | POA: Insufficient documentation

## 2023-04-27 DIAGNOSIS — Z79899 Other long term (current) drug therapy: Secondary | ICD-10-CM | POA: Insufficient documentation

## 2023-04-27 DIAGNOSIS — I10 Essential (primary) hypertension: Secondary | ICD-10-CM

## 2023-04-27 MED ORDER — AMLODIPINE BESYLATE 5 MG PO TABS: 5.0000 mg | ORAL_TABLET | Freq: Every day | ORAL | 1 refills | Status: DC

## 2023-04-27 MED ORDER — LEVOTHYROXINE SODIUM 75 MCG PO TABS: 75.0000 ug | ORAL_TABLET | Freq: Every day | ORAL | 1 refills | Status: DC

## 2023-04-27 MED ORDER — HYDROCHLOROTHIAZIDE 12.5 MG PO CAPS: ORAL_CAPSULE | ORAL | 1 refills | Status: DC

## 2023-04-27 NOTE — Progress Notes (Signed)
 Patient ID: Amanda Blanchard, female    DOB: 05/09/1955  MRN: [id number]  CC: Hypertension (HTN f/u. Med refills. /No questions / concerns /No to colonoscopy)   Subjective: Amanda Blanchard is a 68 y.o. female who presents for chronic ds management. Her concerns today include:  HTN, HL, hypothyroidism, COVID pneumonia 02/2020, CKD2- 3, markedly abnormal LFTs 03/2020 with cirrhotic changes on imaging.  LFTs have subsequently normalized.  CT of the abdomen showed changes consistent with cirrhosis   AMN Language interpreter used during this encounter. #[id number] [place]  Discussed the use of AI scribe software for clinical note transcription with the patient, who gave verbal consent to proceed.  History of Present Illness   [name], a patient with hypertension, thyroid disease, and liver cirrhosis, presents for a follow-up visit. She reports adherence to her prescribed medications, including amlodipine 5mg  daily, hydrochlorothiazide 12.5mg  daily, and levothyroxine daily. She checks her blood pressure at home, albeit not frequently, and has made dietary modifications to limit her salt intake. She denies experiencing chest pain, shortness of breath, or leg swelling.  Cirrhosis: Regarding her liver disease, she has not yet scheduled an appointment with a gastroenterologist, despite previous recommendations.  On last visit I had given her the information and advised that she has one of her children who speaks English schedule the appointment for her.  She expresses a preference to delay this consultation.  CKD 3a:  her kidney function, last checked in August of the previous year, had shown improvement with GFR 69, previously in 25s.  She denies taking any over-the-counter medications such as ibuprofen or Advil.     Hypothyroid: She is compliant with taking levothyroxine 75 mcg daily.  Last TSH in December was 3.5 which was normal range.   Patient Active Problem List   Diagnosis Date Noted    Unilateral primary osteoarthritis, left hip 02/15/2021   Status post total replacement of left hip 02/15/2021   Aortic atherosclerosis (HCC) 08/31/2020   Cirrhosis of liver without ascites (HCC) 08/31/2020   Bunion of great toe of right foot 08/31/2020   Obesity (BMI 30.0-34.9) 08/31/2020   Influenza vaccine refused 04/13/2020   Tetanus, diphtheria, and acellular pertussis (Tdap) vaccination declined 04/13/2020   COVID-19 vaccine dose declined 04/13/2020   Essential hypertension 02/11/2020   Hypothyroidism 02/11/2020   HLD (hyperlipidemia) 02/11/2020     Current Outpatient Medications on File Prior to Visit  Medication Sig Dispense Refill   loratadine (CLARITIN) 10 MG tablet Take 1 tablet (10 mg total) by mouth daily. (Patient not taking: Reported on 04/27/2023) 90 tablet 1   No current facility-administered medications on file prior to visit.    No Known Allergies  Social History   Socioeconomic History   Marital status: Married    Spouse name: Not on file   Number of children: 2   Years of education: Not on file   Highest education level: Not on file  Occupational History   Not on file  Tobacco Use   Smoking status: Never   Smokeless tobacco: Never  Vaping Use   Vaping status: Never Used  Substance and Sexual Activity   Alcohol use: Not Currently   Drug use: Not Currently   Sexual activity: Not on file  Other Topics Concern   Not on file  Social History Narrative   Not on file   Social Drivers of Health   Financial Resource Strain: Low Risk  (04/27/2023)   Overall Financial Resource Strain (CARDIA)  Difficulty of Paying Living Expenses: Not very hard  Food Insecurity: No Food Insecurity (04/27/2023)   Hunger Vital Sign    Worried About Running Out of Food in the Last Year: Never true    Ran Out of Food in the Last Year: Never true  Transportation Needs: No Transportation Needs (04/27/2023)   PRAPARE - Administrator, Civil Service (Medical): No     Lack of Transportation (Non-Medical): No  Physical Activity: Inactive (04/27/2023)   Exercise Vital Sign    Days of Exercise per Week: 0 days    Minutes of Exercise per Session: 0 min  Stress: No Stress Concern Present (04/27/2023)   [employer] of Occupational Health - Occupational Stress Questionnaire    Feeling of Stress : Not at all  Social Connections: Socially Integrated (04/27/2023)   Social Connection and Isolation Panel [NHANES]    Frequency of Communication with Friends and Family: More than three times a week    Frequency of Social Gatherings with Friends and Family: Once a week    Attends Religious Services: More than 4 times per year    Active Member of [employer] or Organizations: Yes    Attends Banker Meetings: 1 to 4 times per year    Marital Status: Married  Catering manager Violence: Not At Risk (04/27/2023)   Humiliation, Afraid, Rape, and Kick questionnaire    Fear of Current or Ex-Partner: No    Emotionally Abused: No    Physically Abused: No    Sexually Abused: No    Family History  Problem Relation Age of Onset   Diabetes Mellitus II Neg Hx     Past Surgical History:  Procedure Laterality Date   BILATERAL SALPINGECTOMY     many years ago   TOTAL HIP ARTHROPLASTY Left 02/15/2021   Procedure: Left TOTAL HIP ARTHROPLASTY ANTERIOR APPROACH;  Surgeon: Kathryne Hitch, MD;  Location: [facility] OR;  Service: Orthopedics;  Laterality: Left;    ROS: Review of Systems Negative except as stated above  PHYSICAL EXAM: BP 103/64 (BP Location: Left Arm, Patient Position: Sitting, Cuff Size: Normal)   Pulse (!) 58   Ht 5\' 3"  (1.6 m)   Wt 149 lb (67.6 kg)   SpO2 98%   BMI 26.39 kg/m   Physical Exam  General appearance - alert, well appearing, older Hispanic female and in no distress Mental status - normal mood, behavior, speech, dress, motor activity, and thought processes Neck - supple, no significant adenopathy Chest - clear to  auscultation, no wheezes, rales or rhonchi, symmetric air entry Heart - normal rate, regular rhythm, normal S1, S2, no murmurs, rubs, clicks or gallops Extremities - peripheral pulses normal, no pedal edema, no clubbing or cyanosis      Latest Ref Rng & Units 08/22/2022    9:32 AM 12/16/2021   10:35 AM 06/16/2021    4:35 PM  CMP  Glucose 70 - 99 mg/dL 89  83  161   BUN 8 - 27 mg/dL 19  26  22    Creatinine 0.57 - 1.00 mg/dL 0.96  0.45  4.09   Sodium 134 - 144 mmol/L 146  142  139   Potassium 3.5 - 5.2 mmol/L 3.7  3.8  3.5   Chloride 96 - 106 mmol/L 105  103  98   CO2 20 - 29 mmol/L 26  25  23    Calcium 8.7 - 10.3 mg/dL 81.1  9.8  91.4   Total Protein 6.0 - 8.5  g/dL 6.9  7.0    Total Bilirubin 0.0 - 1.2 mg/dL 0.6  0.6    Alkaline Phos 44 - 121 IU/L 105  86    AST 0 - 40 IU/L 20  20    ALT 0 - 32 IU/L 12  12     Lipid Panel     Component Value Date/Time   CHOL 270 (H) 12/16/2021 1035   TRIG 117 12/16/2021 1035   HDL 74 12/16/2021 1035   CHOLHDL 3.6 12/16/2021 1035   LDLCALC 176 (H) 12/16/2021 1035    CBC    Component Value Date/Time   WBC 7.4 12/26/2022 1047   WBC 7.2 02/16/2021 0726   RBC 4.51 12/26/2022 1047   RBC 3.30 (L) 02/16/2021 0726   HGB 14.0 12/26/2022 1047   HCT 42.4 12/26/2022 1047   PLT 282 12/26/2022 1047   MCV 94 12/26/2022 1047   MCH 31.0 12/26/2022 1047   MCH 29.7 02/16/2021 0726   MCHC 33.0 12/26/2022 1047   MCHC 31.8 02/16/2021 0726   RDW 13.1 12/26/2022 1047   LYMPHSABS 0.8 02/16/2020 0341   MONOABS 0.9 02/16/2020 0341   EOSABS 0.0 02/16/2020 0341   BASOSABS 0.0 02/16/2020 0341    ASSESSMENT AND PLAN: 1. Essential hypertension (Primary) Stable.  Continue current dose of amlodipine and HCTZ. - amLODipine (NORVASC) 5 MG tablet; Take 1 tablet (5 mg total) by mouth daily.  Dispense: 90 tablet; Refill: 1 - hydrochlorothiazide (MICROZIDE) 12.5 MG capsule; TOME 1 CAPSULA UNA VEZ AL DIA.  Dispense: 90 capsule; Refill: 1 - Comprehensive metabolic panel  with GFR  2. Acquired hypothyroidism Continue levothyroxine. - levothyroxine (SYNTHROID) 75 MCG tablet; Take 1 tablet (75 mcg total) by mouth daily.  Dispense: 90 tablet; Refill: 1 - TSH  3. Cirrhosis of liver without ascites, unspecified hepatic cirrhosis type (HCC) Strongly encouraged her to allow me to refer her again to the gastroenterologist so that we can find out what has caused the cirrhotic changes seen on imaging studies.  Patient declines.  4. Stage 3a chronic kidney disease (HCC) Improved.  Continue to avoid NSAIDs.  5. Screening for colon cancer Patient does not want colonoscopy but agrees to fit test. - Fecal occult blood, imunochemical([facility])    Patient was given the opportunity to ask questions.  Patient verbalized understanding of the plan and was able to repeat key elements of the plan.   This documentation was completed using Paediatric nurse.  Any transcriptional errors are unintentional.  Orders Placed This Encounter  Procedures   Fecal occult blood, imunochemical([facility])   TSH   Comprehensive metabolic panel with GFR     Requested Prescriptions   Signed Prescriptions Disp Refills   amLODipine (NORVASC) 5 MG tablet 90 tablet 1    Sig: Take 1 tablet (5 mg total) by mouth daily.   levothyroxine (SYNTHROID) 75 MCG tablet 90 tablet 1    Sig: Take 1 tablet (75 mcg total) by mouth daily.   hydrochlorothiazide (MICROZIDE) 12.5 MG capsule 90 capsule 1    Sig: TOME 1 CAPSULA UNA VEZ AL DIA.    Return in about 5 months (around 09/27/2023).  Jonah Blue, MD, FACP

## 2023-04-28 ENCOUNTER — Encounter: Payer: Self-pay | Admitting: Internal Medicine

## 2023-04-28 ENCOUNTER — Other Ambulatory Visit: Payer: Self-pay | Admitting: Internal Medicine

## 2023-04-28 DIAGNOSIS — E039 Hypothyroidism, unspecified: Secondary | ICD-10-CM

## 2023-04-28 LAB — COMPREHENSIVE METABOLIC PANEL WITH GFR
ALT: 9 IU/L (ref 0–32)
AST: 17 IU/L (ref 0–40)
Albumin: 4.3 g/dL (ref 3.9–4.9)
Alkaline Phosphatase: 104 IU/L (ref 44–121)
BUN/Creatinine Ratio: 19 (ref 12–28)
BUN: 20 mg/dL (ref 8–27)
Bilirubin Total: 0.5 mg/dL (ref 0.0–1.2)
CO2: 20 mmol/L (ref 20–29)
Calcium: 10 mg/dL (ref 8.7–10.3)
Chloride: 100 mmol/L (ref 96–106)
Creatinine, Ser: 1.03 mg/dL — ABNORMAL HIGH (ref 0.57–1.00)
Globulin, Total: 3 g/dL (ref 1.5–4.5)
Glucose: 85 mg/dL (ref 70–99)
Potassium: 3.6 mmol/L (ref 3.5–5.2)
Sodium: 143 mmol/L (ref 134–144)
Total Protein: 7.3 g/dL (ref 6.0–8.5)
eGFR: 60 mL/min/{1.73_m2} (ref >59)

## 2023-04-28 LAB — TSH: TSH: 7.31 u[IU]/mL — ABNORMAL HIGH (ref 0.450–4.500)

## 2023-04-28 NOTE — Progress Notes (Signed)
 Thyroid level is not at goal.  If she has been taking the levothyroxine consistently every morning as she indicated on recent visit, then we need to increase the dose from 75 mcg daily to 88 mcg daily.  Once she has been on the increased dose for at least 1 month, she should return to the lab to have thyroid level rechecked.  Please let me know her response as to whether she has been taking the medicine consistently.  Kidney function not 100% but stable.  Liver function test normal.

## 2023-07-11 LAB — HM MAMMOGRAPHY: HM Mammogram: NORMAL (ref 0–4)

## 2023-09-26 ENCOUNTER — Telehealth: Payer: Self-pay | Admitting: Internal Medicine

## 2023-09-26 NOTE — Telephone Encounter (Signed)
 Doris Agricultural consultant confirmed appt for 9/11

## 2023-09-27 ENCOUNTER — Encounter: Payer: Self-pay | Admitting: Internal Medicine

## 2023-09-27 ENCOUNTER — Ambulatory Visit: Attending: Internal Medicine | Admitting: Internal Medicine

## 2023-09-27 VITALS — BP 117/74 | HR 55 | Ht 63.0 in | Wt 146.0 lb

## 2023-09-27 DIAGNOSIS — Z1211 Encounter for screening for malignant neoplasm of colon: Secondary | ICD-10-CM

## 2023-09-27 DIAGNOSIS — Z79899 Other long term (current) drug therapy: Secondary | ICD-10-CM | POA: Insufficient documentation

## 2023-09-27 DIAGNOSIS — B353 Tinea pedis: Secondary | ICD-10-CM | POA: Diagnosis not present

## 2023-09-27 DIAGNOSIS — N182 Chronic kidney disease, stage 2 (mild): Secondary | ICD-10-CM

## 2023-09-27 DIAGNOSIS — E039 Hypothyroidism, unspecified: Secondary | ICD-10-CM

## 2023-09-27 DIAGNOSIS — I10 Essential (primary) hypertension: Secondary | ICD-10-CM | POA: Diagnosis not present

## 2023-09-27 DIAGNOSIS — I129 Hypertensive chronic kidney disease with stage 1 through stage 4 chronic kidney disease, or unspecified chronic kidney disease: Secondary | ICD-10-CM | POA: Insufficient documentation

## 2023-09-27 DIAGNOSIS — Z2821 Immunization not carried out because of patient refusal: Secondary | ICD-10-CM

## 2023-09-27 MED ORDER — CLOTRIMAZOLE-BETAMETHASONE 1-0.05 % EX CREA: 1.0000 | TOPICAL_CREAM | Freq: Every day | CUTANEOUS | 0 refills | Status: DC

## 2023-09-27 MED ORDER — AMLODIPINE BESYLATE 5 MG PO TABS: 5.0000 mg | ORAL_TABLET | Freq: Every day | ORAL | 1 refills | Status: DC

## 2023-09-27 MED ORDER — HYDROCHLOROTHIAZIDE 12.5 MG PO CAPS: ORAL_CAPSULE | ORAL | 1 refills | Status: DC

## 2023-09-27 MED ORDER — LEVOTHYROXINE SODIUM 75 MCG PO TABS: 75.0000 ug | ORAL_TABLET | Freq: Every day | ORAL | 1 refills | Status: DC

## 2023-09-27 NOTE — Patient Instructions (Addendum)
 I have prescribed Lotrisone  cream for you to apply to your foot every day for 1 week

## 2023-09-27 NOTE — Progress Notes (Signed)
 Patient ID: Amanda Blanchard, female    DOB: 07-31-1955  MRN: [id number]  CC: Hypertension (HTN f/u.  Med refills. /R foot fungus, itching, dry patches X3 mo/No to flu vax. Mammogram completed on at [facility] - next appt sched for 07/11/24)   Subjective: Amanda Blanchard is a 68 y.o. female who presents for chronic ds management and itchy foot rash. Her concerns today include:  HTN, HL, hypothyroidism, COVID pneumonia 02/2020, CKD2- 3, markedly abnormal LFTs 03/2020 with cirrhotic changes on imaging.  LFTs have subsequently normalized.  CT of the abdomen showed changes consistent with cirrhosis   [employer]N Language interpreter used during this encounter. #[id number] Julio  She reports adherence to her prescribed medications, including amlodipine  5mg  daily, hydrochlorothiazide  12.5mg  daily, and levothyroxine  75mcg daily. Checks BP at home every 1-2 weeks, reports 120 systolic, continues to limit her salt intake. She denies experiencing chest pain, shortness of breath, or leg swelling.    CKD 2: Shown improvement with GFR 60, previously in 50s.  She denies taking any over-the-counter medications such as ibuprofen or Advil.     Hypothyroid: She is compliant with taking levothyroxine  75 mcg daily. Last TSH on 04/2023 was elevated at 7.3 after not taking her medication for some months while traveling. Pt was supposed to come to the lab for a repeat TSH in the past but she did not return until today. Pt denies heart palpitations, weight changes, or fatigue.   R foot: Since 05/2023 has had a itchy, dry, hyperpigmented circular area on the medial plantar surface of the R foot. Has tried clotrimazole  cream and Terbinafine cream daily which does not help. Tried salt water as well w/ no relief.   HM:  Declines Flu shot  Mammogram - at [facility] in 06/2023- normal  Fit testing- has the kit but hasn't sent it yet (pt says she brought it to clinic) Dexa scan- declines    Patient Active Problem List   Diagnosis  Date Noted   Unilateral primary osteoarthritis, left hip 02/15/2021   Status post total replacement of left hip 02/15/2021   Aortic atherosclerosis (HCC) 08/31/2020   Cirrhosis of liver without ascites (HCC) 08/31/2020   Bunion of great toe of right foot 08/31/2020   Obesity (BMI 30.0-34.9) 08/31/2020   Influenza vaccine refused 04/13/2020   Tetanus, diphtheria, and acellular pertussis (Tdap) vaccination declined 04/13/2020   COVID-19 vaccine dose declined 04/13/2020   Essential hypertension 02/11/2020   Hypothyroidism 02/11/2020   HLD (hyperlipidemia) 02/11/2020     Current Outpatient Medications on File Prior to Visit  Medication Sig Dispense Refill   loratadine  (CLARITIN ) 10 MG tablet Take 1 tablet (10 mg total) by mouth daily. 90 tablet 1   No current facility-administered medications on file prior to visit.    No Known Allergies  Social History   Socioeconomic History   Marital status: Married    Spouse name: Not on file   Number of children: 2   Years of education: Not on file   Highest education level: Not on file  Occupational History   Not on file  Tobacco Use   Smoking status: Never   Smokeless tobacco: Never  Vaping Use   Vaping status: Never Used  Substance and Sexual Activity   Alcohol use: Not Currently   Drug use: Not Currently   Sexual activity: Not on file  Other Topics Concern   Not on file  Social History Narrative   Not on file   Social Drivers of Health  Financial Resource Strain: Low Risk  (04/27/2023)   Overall Financial Resource Strain (CARDIA)    Difficulty of Paying Living Expenses: Not very hard  Food Insecurity: No Food Insecurity (04/27/2023)   Hunger Vital Sign    Worried About Running Out of Food in the Last Year: Never true    Ran Out of Food in the Last Year: Never true  Transportation Needs: No Transportation Needs (04/27/2023)   PRAPARE - Administrator, Civil Service (Medical): No    Lack of Transportation  (Non-Medical): No  Physical Activity: Inactive (04/27/2023)   Exercise Vital Sign    Days of Exercise per Week: 0 days    Minutes of Exercise per Session: 0 min  Stress: No Stress Concern Present (04/27/2023)   [employer] of Occupational Health - Occupational Stress Questionnaire    Feeling of Stress : Not at all  Social Connections: Socially Integrated (04/27/2023)   Social Connection and Isolation Panel    Frequency of Communication with Friends and Family: More than three times a week    Frequency of Social Gatherings with Friends and Family: Once a week    Attends Religious Services: More than 4 times per year    Active Member of [employer] or Organizations: Yes    Attends Banker Meetings: 1 to 4 times per year    Marital Status: Married  Catering manager Violence: Not At Risk (04/27/2023)   Humiliation, Afraid, Rape, and Kick questionnaire    Fear of Current or Ex-Partner: No    Emotionally Abused: No    Physically Abused: No    Sexually Abused: No    Family History  Problem Relation Age of Onset   Diabetes Mellitus II Neg Hx     Past Surgical History:  Procedure Laterality Date   BILATERAL SALPINGECTOMY     many years ago   TOTAL HIP ARTHROPLASTY Left 02/15/2021   Procedure: Left TOTAL HIP ARTHROPLASTY ANTERIOR APPROACH;  Surgeon: Vernetta Lonni GRADE, MD;  Location: [facility] OR;  Service: Orthopedics;  Laterality: Left;    ROS: Review of Systems Negative except as stated above  PHYSICAL EXAM: BP 117/74 (BP Location: Left Arm, Patient Position: Sitting, Cuff Size: Normal)   Pulse (!) 55   Ht 5' 3 (1.6 m)   Wt 146 lb (66.2 kg)   SpO2 99%   BMI 25.86 kg/m   Physical Exam  General appearance - alert, well appearing older Hispanic female and in no distress Mental status - normal mood, behavior, speech, dress, motor activity, and thought processes Neck: thyroid non tender and non enlarged w/o nodules, no palpable lymph nodes Chest - clear to  auscultation, no wheezes, rales or rhonchi, symmetric air entry Heart - normal rate, regular rhythm, normal S1, S2, no murmurs, rubs, clicks or gallops Extremities - no LE edema Skin - hyperpigmented circular area on the medial medial plantar surface of the R foot with mild flaky skin changes.       Latest Ref Rng & Units 04/27/2023   11:41 AM 08/22/2022    9:32 AM 12/16/2021   10:35 AM  CMP  Glucose 70 - 99 mg/dL 85  89  83   BUN 8 - 27 mg/dL 20  19  26    Creatinine 0.57 - 1.00 mg/dL 8.96  9.06  8.90   Sodium 134 - 144 mmol/L 143  146  142   Potassium 3.5 - 5.2 mmol/L 3.6  3.7  3.8   Chloride 96 - 106 mmol/L  100  105  103   CO2 20 - 29 mmol/L 20  26  25    Calcium  8.7 - 10.3 mg/dL 89.9  89.8  9.8   Total Protein 6.0 - 8.5 g/dL 7.3  6.9  7.0   Total Bilirubin 0.0 - 1.2 mg/dL 0.5  0.6  0.6   Alkaline Phos 44 - 121 IU/L 104  105  86   AST 0 - 40 IU/L 17  20  20    ALT 0 - 32 IU/L 9  12  12     Lipid Panel     Component Value Date/Time   CHOL 270 (H) 12/16/2021 1035   TRIG 117 12/16/2021 1035   HDL 74 12/16/2021 1035   CHOLHDL 3.6 12/16/2021 1035   LDLCALC 176 (H) 12/16/2021 1035    CBC    Component Value Date/Time   WBC 7.4 12/26/2022 1047   WBC 7.2 02/16/2021 0726   RBC 4.51 12/26/2022 1047   RBC 3.30 (L) 02/16/2021 0726   HGB 14.0 12/26/2022 1047   HCT 42.4 12/26/2022 1047   PLT 282 12/26/2022 1047   MCV 94 12/26/2022 1047   MCH 31.0 12/26/2022 1047   MCH 29.7 02/16/2021 0726   MCHC 33.0 12/26/2022 1047   MCHC 31.8 02/16/2021 0726   RDW 13.1 12/26/2022 1047   LYMPHSABS 0.8 02/16/2020 0341   MONOABS 0.9 02/16/2020 0341   EOSABS 0.0 02/16/2020 0341   BASOSABS 0.0 02/16/2020 0341    ASSESSMENT AND PLAN:  Assessment and Plan  1. Essential hypertension (Primary) At goal. BP 117/74 today. Continue hydrochlorothiazide  and amlodipine  as prescribed. - hydrochlorothiazide  (MICROZIDE ) 12.5 MG capsule; TOME 1 CAPSULA UNA VEZ AL DIA.  Dispense: 90 capsule; Refill: 1 -  amLODipine  (NORVASC ) 5 MG tablet; Take 1 tablet (5 mg total) by mouth daily.  Dispense: 90 tablet; Refill: 1  2. Acquired hypothyroidism Last TSH was 7.3 5 months ago. Will recheck today. Pt currently asymptomatic. Pulse 55 today but appears near baseline. - levothyroxine  (SYNTHROID ) 75 MCG tablet; Take 1 tablet (75 mcg total) by mouth daily.  Dispense: 90 tablet; Refill: 1 - TSH  3. Fungal infection right foot Pt has been using OTC antifungals since 05/2023 on plantar surface of R foot without significant relief. It appears to be fungal infection. Likely needs a stronger medication. Will start her on Lotrisone  cream to apply daily for a week to see if sx resolve. - clotrimazole -betamethasone  (LOTRISONE ) cream; Apply 1 Application topically daily.  Dispense: 30 g; Refill: 0  4. CKD (chronic kidney disease) stage 2, GFR 60-89 ml/min Stable. Continue to avoid NSAIDs.   5. Influenza vaccination declined  6. Screening for colon cancer Patient has fit test at home. She agrees to complete her fit test at home and bring it to clinic on the same day.  Patient was given the opportunity to ask questions.  Patient verbalized understanding of the plan and was able to repeat key elements of the plan.   Orders Placed This Encounter  Procedures   TSH     Requested Prescriptions   Signed Prescriptions Disp Refills   hydrochlorothiazide  (MICROZIDE ) 12.5 MG capsule 90 capsule 1    Sig: TOME 1 CAPSULA UNA VEZ AL DIA.   levothyroxine  (SYNTHROID ) 75 MCG tablet 90 tablet 1    Sig: Take 1 tablet (75 mcg total) by mouth daily.   amLODipine  (NORVASC ) 5 MG tablet 90 tablet 1    Sig: Take 1 tablet (5 mg total) by mouth daily.   clotrimazole -betamethasone  (  LOTRISONE ) cream 30 g 0    Sig: Apply 1 Application topically daily.    Return in about 4 months (around 01/27/2024) for Medicare Wellness Visit in 1-8  wks with LPN.  This is a Psychologist, occupational Note.  The care of the patient was discussed with Dr.  Vicci and the assessment and plan formulated with her assistance.  Please see her attestation of this encounter. Duwaine Amber MS3 [facility]  Evaluation and management procedures were performed by me with Medical Student in attendance, note written by Medical Student under my supervision and collaboration. I have reviewed the note and I agree with the management and plan.   Barnie Vicci, MD, FAAFP. [facility] [place], [place] [phone]   09/27/2023, 1:30 PM

## 2023-09-28 ENCOUNTER — Ambulatory Visit: Payer: Self-pay | Admitting: Internal Medicine

## 2023-09-28 LAB — TSH: TSH: 2.16 u[IU]/mL (ref 0.450–4.500)

## 2024-01-28 ENCOUNTER — Encounter: Payer: Self-pay | Admitting: Internal Medicine

## 2024-01-28 ENCOUNTER — Ambulatory Visit: Attending: Internal Medicine | Admitting: Internal Medicine

## 2024-01-28 VITALS — BP 116/62 | HR 52 | Ht 63.0 in | Wt 152.0 lb

## 2024-01-28 DIAGNOSIS — Z1211 Encounter for screening for malignant neoplasm of colon: Secondary | ICD-10-CM

## 2024-01-28 DIAGNOSIS — E039 Hypothyroidism, unspecified: Secondary | ICD-10-CM | POA: Insufficient documentation

## 2024-01-28 DIAGNOSIS — Z603 Acculturation difficulty: Secondary | ICD-10-CM | POA: Diagnosis not present

## 2024-01-28 DIAGNOSIS — N183 Chronic kidney disease, stage 3 unspecified: Secondary | ICD-10-CM | POA: Insufficient documentation

## 2024-01-28 DIAGNOSIS — Z79890 Hormone replacement therapy (postmenopausal): Secondary | ICD-10-CM | POA: Insufficient documentation

## 2024-01-28 DIAGNOSIS — Z79899 Other long term (current) drug therapy: Secondary | ICD-10-CM | POA: Insufficient documentation

## 2024-01-28 DIAGNOSIS — I10 Essential (primary) hypertension: Secondary | ICD-10-CM

## 2024-01-28 DIAGNOSIS — Z8616 Personal history of COVID-19: Secondary | ICD-10-CM | POA: Insufficient documentation

## 2024-01-28 DIAGNOSIS — I129 Hypertensive chronic kidney disease with stage 1 through stage 4 chronic kidney disease, or unspecified chronic kidney disease: Secondary | ICD-10-CM | POA: Insufficient documentation

## 2024-01-28 DIAGNOSIS — K7460 Unspecified cirrhosis of liver: Secondary | ICD-10-CM | POA: Insufficient documentation

## 2024-01-28 MED ORDER — AMLODIPINE BESYLATE 5 MG PO TABS: 5.0000 mg | ORAL_TABLET | Freq: Every day | ORAL | 1 refills | Status: AC

## 2024-01-28 MED ORDER — HYDROCHLOROTHIAZIDE 12.5 MG PO CAPS: ORAL_CAPSULE | ORAL | 1 refills | Status: AC

## 2024-01-28 MED ORDER — LEVOTHYROXINE SODIUM 75 MCG PO TABS: 75.0000 ug | ORAL_TABLET | Freq: Every day | ORAL | 1 refills | Status: AC

## 2024-01-28 NOTE — Patient Instructions (Signed)
" °  VISIT SUMMARY: During your visit, we reviewed your chronic medical conditions and discussed your current treatment plans. Your blood pressure and thyroid levels are well-controlled with your current medications. We also addressed your liver condition and the need for follow-up with a gastroenterologist. Additionally, we discussed the importance of routine health maintenance and scheduled necessary tests and screenings.  YOUR PLAN: -ESSENTIAL HYPERTENSION: Hypertension, or high blood pressure, is when the force of the blood against your artery walls is too high. Your blood pressure is well-controlled with your current medications, amlodipine  5 mg daily and hydrochlorothiazide  12.5 mg daily. Continue taking these medications and limit your salt intake.  -ACQUIRED HYPOTHYROIDISM: Hypothyroidism is a condition where your thyroid gland does not produce enough thyroid hormone. Your thyroid function is stable with your current medication, levothyroxine  75 mcg daily. Continue taking this medication as prescribed.  -CIRRHOSIS OF LIVER: Cirrhosis is a late stage of scarring (fibrosis) of the liver caused by many forms of liver diseases and conditions. Your liver function tests are normal, but imaging shows cirrhosis. We have referred you to a gastroenterologist for further evaluation and updated your contact information to include your son's phone number for scheduling.  -GENERAL HEALTH MAINTENANCE: Routine health maintenance is important for early detection and management of potential health issues. We have ordered tests to check your cholesterol, kidney function, liver function, and blood count. You have been provided with a stool kit for colon cancer screening, which should be returned the same day. We will schedule your mammogram for June 2026 and your annual Medicare visit.  INSTRUCTIONS: Please continue taking your medications as prescribed and follow the dietary recommendations discussed. Return the  stool kit for colon cancer screening today. We will schedule your mammogram for June 2026 and your annual Medicare visit. Follow up with the gastroenterologist as soon as possible using the updated contact information.                      Contains text generated by Abridge.                                 Contains text generated by Abridge.   "

## 2024-01-28 NOTE — Progress Notes (Signed)
 "   Patient ID: Amanda Blanchard, female    DOB: [birth year]  MRN: [id number]  CC: Hypertension (HTN f/u. /No questions / concerns/No to flu vax. Yes to stool kit. )   Subjective: Amanda Blanchard is a 69 y.o. female who presents for chronic ds management. Her chronic medical issues include:  HTN, HL, hypothyroidism, COVID pneumonia 02/2020, CKD2- 3, markedly abnormal LFTs 03/2020 with cirrhotic changes on imaging.  LFTs have subsequently normalized.  CT of the abdomen showed changes consistent with cirrhosis   AMN Language interpreter used during this encounter. #[id number]  Discussed the use of AI scribe software for clinical note transcription with the patient, who gave verbal consent to proceed.  History of Present Illness Amanda Blanchard is a 69 year old female who presents for follow-up of her chronic medical issues.  She is managing hypertension with amlodipine  5 mg daily and hydrochlorothiazide  12.5 mg daily. She has not been monitoring her blood pressure at home. No chest pain or shortness of breath. She is actively limiting salt intake in her diet.  For her thyroid disorder, she is on levothyroxine  75 mcg daily. Her thyroid levels were last checked in September and were within normal range.  She is due for laboratory tests to assess cholesterol, kidney function, liver function, and blood count. US  Imaging studies from 2020 showed changes in liver consistent with cirrhosis even though LFTs have normalized. I have tried several times to get hre in with GI. She has faced challenges in scheduling appt with gastroenterologist due to language barrier.     HM: due for colon CA screen. Given stool kit in past which she has misplaced. Due for Welcome to Medicare Visit. Patient Active Problem List   Diagnosis Date Noted   Unilateral primary osteoarthritis, left hip 02/15/2021   Status post total replacement of left hip 02/15/2021   Aortic atherosclerosis 08/31/2020    Cirrhosis of liver without ascites (HCC) 08/31/2020   Bunion of great toe of right foot 08/31/2020   Obesity (BMI 30.0-34.9) 08/31/2020   Influenza vaccine refused 04/13/2020   Tetanus, diphtheria, and acellular pertussis (Tdap) vaccination declined 04/13/2020   COVID-19 vaccine dose declined 04/13/2020   Essential hypertension 02/11/2020   Hypothyroidism 02/11/2020   HLD (hyperlipidemia) 02/11/2020     Medications Ordered Prior to Encounter[1]  Allergies[2]  Social History   Socioeconomic History   Marital status: Married    Spouse name: Not on file   Number of children: 2   Years of education: Not on file   Highest education level: Not on file  Occupational History   Not on file  Tobacco Use   Smoking status: Never   Smokeless tobacco: Never  Vaping Use   Vaping status: Never Used  Substance and Sexual Activity   Alcohol use: Not Currently   Drug use: Not Currently   Sexual activity: Not on file  Other Topics Concern   Not on file  Social History Narrative   Not on file   Social Drivers of Health   Tobacco Use: Low Risk (01/28/2024)   Patient History    Smoking Tobacco Use: Never    Smokeless Tobacco Use: Never    Passive Exposure: Not on file  Financial Resource Strain: Low Risk (04/27/2023)   Overall Financial Resource Strain (CARDIA)    Difficulty of Paying Living Expenses: Not very hard  Food Insecurity: No Food Insecurity (04/27/2023)   Hunger Vital Sign    Worried About Programme Researcher, Broadcasting/film/video in  the Last Year: Never true    Ran Out of Food in the Last Year: Never true  Transportation Needs: No Transportation Needs (04/27/2023)   PRAPARE - Administrator, Civil Service (Medical): No    Lack of Transportation (Non-Medical): No  Physical Activity: Inactive (04/27/2023)   Exercise Vital Sign    Days of Exercise per Week: 0 days    Minutes of Exercise per Session: 0 min  Stress: No Stress Concern Present (04/27/2023)   [employer] of  Occupational Health - Occupational Stress Questionnaire    Feeling of Stress : Not at all  Social Connections: Socially Integrated (04/27/2023)   Social Connection and Isolation Panel    Frequency of Communication with Friends and Family: More than three times a week    Frequency of Social Gatherings with Friends and Family: Once a week    Attends Religious Services: More than 4 times per year    Active Member of [employer] or Organizations: Yes    Attends Banker Meetings: 1 to 4 times per year    Marital Status: Married  Catering Manager Violence: Not At Risk (04/27/2023)   Humiliation, Afraid, Rape, and Kick questionnaire    Fear of Current or Ex-Partner: No    Emotionally Abused: No    Physically Abused: No    Sexually Abused: No  Depression (PHQ2-9): Low Risk (09/27/2023)   Depression (PHQ2-9)    PHQ-2 Score: 0  Alcohol Screen: Low Risk (04/27/2023)   Alcohol Screen    Last Alcohol Screening Score (AUDIT): 0  Housing: Low Risk (04/27/2023)   Housing Stability Vital Sign    Unable to Pay for Housing in the Last Year: No    Number of Times Moved in the Last Year: 0    Homeless in the Last Year: No  Utilities: Not At Risk (04/27/2023)   [employer] Utilities    Threatened with loss of utilities: No  Health Literacy: Adequate Health Literacy (04/27/2023)   B1300 Health Literacy    Frequency of need for help with medical instructions: Never    Family History  Problem Relation Age of Onset   Diabetes Mellitus II Neg Hx     Past Surgical History:  Procedure Laterality Date   BILATERAL SALPINGECTOMY     many years ago   TOTAL HIP ARTHROPLASTY Left 02/15/2021   Procedure: Left TOTAL HIP ARTHROPLASTY ANTERIOR APPROACH;  Surgeon: Vernetta Lonni GRADE, MD;  Location: [facility] OR;  Service: Orthopedics;  Laterality: Left;    ROS: Review of Systems Negative except as stated above  PHYSICAL EXAM: BP 116/62 (BP Location: Left Arm, Patient Position: Sitting, Cuff Size: Normal)   Pulse  (!) 52   Ht 5' 3 (1.6 m)   Wt 152 lb (68.9 kg)   SpO2 98%   BMI 26.93 kg/m   Wt Readings from Last 3 Encounters:  01/28/24 152 lb (68.9 kg)  09/27/23 146 lb (66.2 kg)  04/27/23 149 lb (67.6 kg)    Physical Exam General appearance - alert, well appearing, and in no distress Mental status - normal mood, behavior, speech, dress, motor activity, and thought processes Eyes: non-icteric sclera, Neck - supple, no significant adenopathy Chest - clear to auscultation, no wheezes, rales or rhonchi, symmetric air entry Heart - normal rate, regular rhythm, normal S1, S2, no murmurs, rubs, clicks or gallops Extremities - peripheral pulses normal, no pedal edema, no clubbing or cyanosis     09/27/2023    9:43 AM 04/27/2023  10:23 AM 12/26/2022    9:47 AM  Depression screen PHQ 2/9  Decreased Interest 0 0 0  Down, Depressed, Hopeless 0 0 0  PHQ - 2 Score 0 0 0  Altered sleeping 0 0 0  Tired, decreased energy 0 0 0  Change in appetite 0 0 0  Feeling bad or failure about yourself  0 0 0  Trouble concentrating 0 0 0  Moving slowly or fidgety/restless 0 0 0  Suicidal thoughts 0 0 0  PHQ-9 Score 0  0  0   Difficult doing work/chores Not difficult at all Not difficult at all Not difficult at all     Data saved with a previous flowsheet row definition       Latest Ref Rng & Units 04/27/2023   11:41 AM 08/22/2022    9:32 AM 12/16/2021   10:35 AM  CMP  Glucose 70 - 99 mg/dL 85  89  83   BUN 8 - 27 mg/dL 20  19  26    Creatinine 0.57 - 1.00 mg/dL 8.96  9.06  8.90   Sodium 134 - 144 mmol/L 143  146  142   Potassium 3.5 - 5.2 mmol/L 3.6  3.7  3.8   Chloride 96 - 106 mmol/L 100  105  103   CO2 20 - 29 mmol/L 20  26  25    Calcium  8.7 - 10.3 mg/dL 89.9  89.8  9.8   Total Protein 6.0 - 8.5 g/dL 7.3  6.9  7.0   Total Bilirubin 0.0 - 1.2 mg/dL 0.5  0.6  0.6   Alkaline Phos 44 - 121 IU/L 104  105  86   AST 0 - 40 IU/L 17  20  20    ALT 0 - 32 IU/L 9  12  12     Lipid Panel     Component Value  Date/Time   CHOL 270 (H) 12/16/2021 1035   TRIG 117 12/16/2021 1035   HDL 74 12/16/2021 1035   CHOLHDL 3.6 12/16/2021 1035   LDLCALC 176 (H) 12/16/2021 1035    CBC    Component Value Date/Time   WBC 7.4 12/26/2022 1047   WBC 7.2 02/16/2021 0726   RBC 4.51 12/26/2022 1047   RBC 3.30 (L) 02/16/2021 0726   HGB 14.0 12/26/2022 1047   HCT 42.4 12/26/2022 1047   PLT 282 12/26/2022 1047   MCV 94 12/26/2022 1047   MCH 31.0 12/26/2022 1047   MCH 29.7 02/16/2021 0726   MCHC 33.0 12/26/2022 1047   MCHC 31.8 02/16/2021 0726   RDW 13.1 12/26/2022 1047   LYMPHSABS 0.8 02/16/2020 0341   MONOABS 0.9 02/16/2020 0341   EOSABS 0.0 02/16/2020 0341   BASOSABS 0.0 02/16/2020 0341    ASSESSMENT AND PLAN: 1. Essential hypertension (Primary) At goal Continue hydrochlorothiazide  and Norvasc . - hydrochlorothiazide  (MICROZIDE ) 12.5 MG capsule; TOME 1 CAPSULA UNA VEZ AL DIA.  Dispense: 90 capsule; Refill: 1 - amLODipine  (NORVASC ) 5 MG tablet; Take 1 tablet (5 mg total) by mouth daily.  Dispense: 90 tablet; Refill: 1 - CBC - Lipid panel - Comprehensive metabolic panel with GFR  2. Acquired hypothyroidism Thyroid function stable with current levothyroxine  dose. - Continue levothyroxine  75 mcg daily. - levothyroxine  (SYNTHROID ) 75 MCG tablet; Take 1 tablet (75 mcg total) by mouth daily.  Dispense: 90 tablet; Refill: 1  3. Cirrhosis of liver without ascites, unspecified hepatic cirrhosis type (HCC) Pt asymptomatic. Based on imaging findings. Does not drink ETOH bev - Ambulatory referral to Gastroenterology  4. Screening for colon cancer - Fecal occult blood, imunochemical([facility])   Patient was given the opportunity to ask questions.  Patient verbalized understanding of the plan and was able to repeat key elements of the plan.   This documentation was completed using Paediatric nurse.  Any transcriptional errors are unintentional.  Orders Placed This Encounter   Procedures   Fecal occult blood, imunochemical([facility])   CBC   Lipid panel   Comprehensive metabolic panel with GFR   Ambulatory referral to Gastroenterology     Requested Prescriptions   Signed Prescriptions Disp Refills   hydrochlorothiazide  (MICROZIDE ) 12.5 MG capsule 90 capsule 1    Sig: TOME 1 CAPSULA UNA VEZ AL DIA.   levothyroxine  (SYNTHROID ) 75 MCG tablet 90 tablet 1    Sig: Take 1 tablet (75 mcg total) by mouth daily.   amLODipine  (NORVASC ) 5 MG tablet 90 tablet 1    Sig: Take 1 tablet (5 mg total) by mouth daily.    Return in about 4 months (around 05/27/2024) for Medicare Wellness Visit in  2-6 wks with CMA/LPN.  Barnie Louder, MD, FACP     [1]  No current outpatient medications on file prior to visit.   No current facility-administered medications on file prior to visit.  [2] No Known Allergies  "

## 2024-01-29 ENCOUNTER — Ambulatory Visit: Payer: Self-pay | Admitting: Internal Medicine

## 2024-01-29 LAB — LIPID PANEL
Chol/HDL Ratio: 3.8 ratio (ref 0.0–4.4)
Cholesterol, Total: 249 mg/dL — ABNORMAL HIGH (ref 100–199)
HDL: 65 mg/dL
LDL Chol Calc (NIH): 160 mg/dL — ABNORMAL HIGH (ref 0–99)
Triglycerides: 134 mg/dL (ref 0–149)
VLDL Cholesterol Cal: 24 mg/dL (ref 5–40)

## 2024-01-29 LAB — CBC
Hematocrit: 44.6 % (ref 34.0–46.6)
Hemoglobin: 14.6 g/dL (ref 11.1–15.9)
MCH: 30.2 pg (ref 26.6–33.0)
MCHC: 32.7 g/dL (ref 31.5–35.7)
MCV: 92 fL (ref 79–97)
Platelets: 276 x10E3/uL (ref 150–450)
RBC: 4.83 x10E6/uL (ref 3.77–5.28)
RDW: 12.9 % (ref 11.7–15.4)
WBC: 6.9 x10E3/uL (ref 3.4–10.8)

## 2024-01-29 LAB — COMPREHENSIVE METABOLIC PANEL WITH GFR
ALT: 13 IU/L (ref 0–32)
AST: 22 IU/L (ref 0–40)
Albumin: 4.3 g/dL (ref 3.9–4.9)
Alkaline Phosphatase: 105 IU/L (ref 49–135)
BUN/Creatinine Ratio: 20 (ref 12–28)
BUN: 19 mg/dL (ref 8–27)
Bilirubin Total: 0.7 mg/dL (ref 0.0–1.2)
CO2: 23 mmol/L (ref 20–29)
Calcium: 10 mg/dL (ref 8.7–10.3)
Chloride: 102 mmol/L (ref 96–106)
Creatinine, Ser: 0.95 mg/dL (ref 0.57–1.00)
Globulin, Total: 3.1 g/dL (ref 1.5–4.5)
Glucose: 83 mg/dL (ref 70–99)
Potassium: 3.8 mmol/L (ref 3.5–5.2)
Sodium: 142 mmol/L (ref 134–144)
Total Protein: 7.4 g/dL (ref 6.0–8.5)
eGFR: 66 mL/min/1.73

## 2024-05-27 ENCOUNTER — Ambulatory Visit: Payer: Self-pay | Admitting: Internal Medicine
